# Patient Record
Sex: Female | Born: 1955 | Race: White | Hispanic: No | Smoking: Never smoker
Health system: Southern US, Community
[De-identification: ages and names within clinical notes are randomized; demographics above are authoritative.]

## PROBLEM LIST (undated history)

## (undated) DIAGNOSIS — E785 Hyperlipidemia, unspecified: Secondary | ICD-10-CM

## (undated) DIAGNOSIS — I872 Venous insufficiency (chronic) (peripheral): Secondary | ICD-10-CM

## (undated) DIAGNOSIS — K589 Irritable bowel syndrome without diarrhea: Secondary | ICD-10-CM

## (undated) DIAGNOSIS — E669 Obesity, unspecified: Secondary | ICD-10-CM

## (undated) DIAGNOSIS — M797 Fibromyalgia: Secondary | ICD-10-CM

## (undated) DIAGNOSIS — D119 Benign neoplasm of major salivary gland, unspecified: Secondary | ICD-10-CM

## (undated) DIAGNOSIS — J45909 Unspecified asthma, uncomplicated: Secondary | ICD-10-CM

## (undated) DIAGNOSIS — K219 Gastro-esophageal reflux disease without esophagitis: Secondary | ICD-10-CM

## (undated) DIAGNOSIS — L723 Sebaceous cyst: Secondary | ICD-10-CM

## (undated) DIAGNOSIS — G43909 Migraine, unspecified, not intractable, without status migrainosus: Secondary | ICD-10-CM

## (undated) DIAGNOSIS — D369 Benign neoplasm, unspecified site: Secondary | ICD-10-CM

## (undated) DIAGNOSIS — IMO0002 Reserved for concepts with insufficient information to code with codable children: Secondary | ICD-10-CM

## (undated) DIAGNOSIS — F419 Anxiety disorder, unspecified: Secondary | ICD-10-CM

## (undated) DIAGNOSIS — R609 Edema, unspecified: Secondary | ICD-10-CM

## (undated) HISTORY — DX: Benign neoplasm of major salivary gland, unspecified: D11.9

## (undated) HISTORY — DX: Fibromyalgia: M79.7

## (undated) HISTORY — DX: Venous insufficiency (chronic) (peripheral): I87.2

## (undated) HISTORY — DX: Migraine, unspecified, not intractable, without status migrainosus: G43.909

## (undated) HISTORY — DX: Anxiety disorder, unspecified: F41.9

## (undated) HISTORY — DX: Obesity, unspecified: E66.9

## (undated) HISTORY — DX: Irritable bowel syndrome, unspecified: K58.9

## (undated) HISTORY — DX: Sebaceous cyst: L72.3

## (undated) HISTORY — DX: Reserved for concepts with insufficient information to code with codable children: IMO0002

## (undated) HISTORY — DX: Edema, unspecified: R60.9

## (undated) HISTORY — PX: MOHS SURGERY: SUR867

## (undated) HISTORY — DX: Hyperlipidemia, unspecified: E78.5

## (undated) HISTORY — DX: Benign neoplasm, unspecified site: D36.9

## (undated) HISTORY — DX: Gastro-esophageal reflux disease without esophagitis: K21.9

## (undated) HISTORY — DX: Unspecified asthma, uncomplicated: J45.909

---

## 1986-07-21 HISTORY — PX: CHOLECYSTECTOMY: SHX55

## 1991-07-22 HISTORY — PX: ABDOMINAL HYSTERECTOMY: SHX81

## 1991-07-22 HISTORY — PX: OTHER SURGICAL HISTORY: SHX169

## 1999-07-22 HISTORY — PX: HEMORRHOID SURGERY: SHX153

## 1999-12-03 ENCOUNTER — Ambulatory Visit (HOSPITAL_COMMUNITY): Admission: RE | Admit: 1999-12-03 | Discharge: 1999-12-03 | Payer: Self-pay | Admitting: Obstetrics and Gynecology

## 1999-12-03 ENCOUNTER — Encounter: Payer: Self-pay | Admitting: Obstetrics and Gynecology

## 1999-12-11 ENCOUNTER — Other Ambulatory Visit: Admission: RE | Admit: 1999-12-11 | Discharge: 1999-12-11 | Payer: Self-pay | Admitting: Obstetrics and Gynecology

## 2000-02-10 ENCOUNTER — Encounter (INDEPENDENT_AMBULATORY_CARE_PROVIDER_SITE_OTHER): Payer: Self-pay | Admitting: *Deleted

## 2000-02-10 ENCOUNTER — Ambulatory Visit (HOSPITAL_BASED_OUTPATIENT_CLINIC_OR_DEPARTMENT_OTHER): Admission: RE | Admit: 2000-02-10 | Discharge: 2000-02-10 | Payer: Self-pay | Admitting: Surgery

## 2001-03-23 ENCOUNTER — Encounter: Payer: Self-pay | Admitting: Obstetrics and Gynecology

## 2001-03-23 ENCOUNTER — Ambulatory Visit (HOSPITAL_COMMUNITY): Admission: RE | Admit: 2001-03-23 | Discharge: 2001-03-23 | Payer: Self-pay | Admitting: Obstetrics and Gynecology

## 2002-06-17 ENCOUNTER — Encounter: Payer: Self-pay | Admitting: Obstetrics and Gynecology

## 2002-06-17 ENCOUNTER — Ambulatory Visit (HOSPITAL_COMMUNITY): Admission: RE | Admit: 2002-06-17 | Discharge: 2002-06-17 | Payer: Self-pay | Admitting: Obstetrics and Gynecology

## 2003-06-29 ENCOUNTER — Ambulatory Visit (HOSPITAL_COMMUNITY): Admission: RE | Admit: 2003-06-29 | Discharge: 2003-06-29 | Payer: Self-pay | Admitting: Obstetrics and Gynecology

## 2005-01-18 HISTORY — PX: OTHER SURGICAL HISTORY: SHX169

## 2005-02-10 ENCOUNTER — Ambulatory Visit (HOSPITAL_COMMUNITY): Admission: RE | Admit: 2005-02-10 | Discharge: 2005-02-11 | Payer: Self-pay | Admitting: Orthopaedic Surgery

## 2005-08-01 ENCOUNTER — Ambulatory Visit: Payer: Self-pay | Admitting: Pulmonary Disease

## 2006-03-10 ENCOUNTER — Encounter: Admission: RE | Admit: 2006-03-10 | Discharge: 2006-03-10 | Payer: Self-pay | Admitting: Orthopaedic Surgery

## 2006-03-13 ENCOUNTER — Ambulatory Visit: Payer: Self-pay | Admitting: Pulmonary Disease

## 2007-06-22 ENCOUNTER — Ambulatory Visit: Payer: Self-pay | Admitting: Pulmonary Disease

## 2007-06-23 DIAGNOSIS — E669 Obesity, unspecified: Secondary | ICD-10-CM

## 2007-06-23 DIAGNOSIS — K219 Gastro-esophageal reflux disease without esophagitis: Secondary | ICD-10-CM

## 2007-06-23 DIAGNOSIS — K589 Irritable bowel syndrome without diarrhea: Secondary | ICD-10-CM

## 2007-06-23 DIAGNOSIS — R609 Edema, unspecified: Secondary | ICD-10-CM

## 2007-06-23 DIAGNOSIS — I872 Venous insufficiency (chronic) (peripheral): Secondary | ICD-10-CM | POA: Insufficient documentation

## 2007-06-23 DIAGNOSIS — J209 Acute bronchitis, unspecified: Secondary | ICD-10-CM

## 2007-07-15 IMAGING — CT CT CERVICAL SPINE W/O CM
3 of 5 series · 12 of 28 positions shown, 14 images · IV contrast (agent unspecified)
Comparison: Intraoperative [HOSPITAL] cervical spine radiographs, 02/10/05.

CLINICAL DATA: Fall.  Cervical fusion with current neck and right shoulder pain.
CERVICAL SPINE CT WITHOUT CONTRAST:
TECHNIQUE: Multidetector CT imaging of the cervical spine was performed.  Multiplanar CT image reconstructions were also generated.

[Series 3: recon 2: cervical spine · axial · 0.23mm/px · z∈[+10,+72]mm · 2 of 151 slices shown]
[im 51/151  bone]
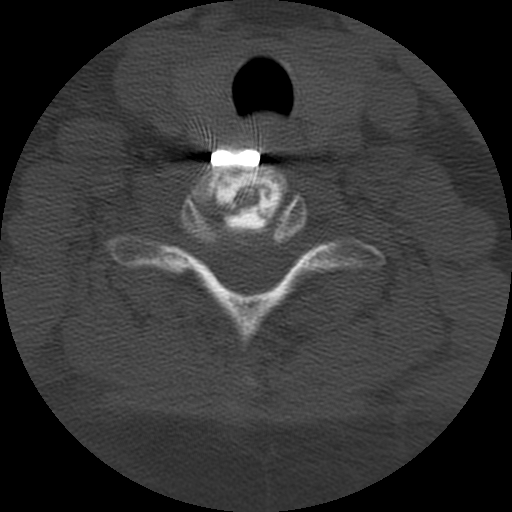
[im 101/151  bone]
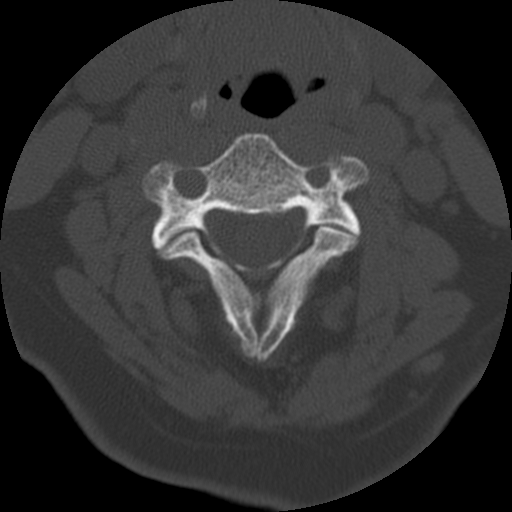

[Series 4: recon 3: cervical spine · axial · 0.23mm/px · z∈[-22,+103]mm · 5 of 301 slices shown, 7 images]
[im 51/301  soft-tissue]
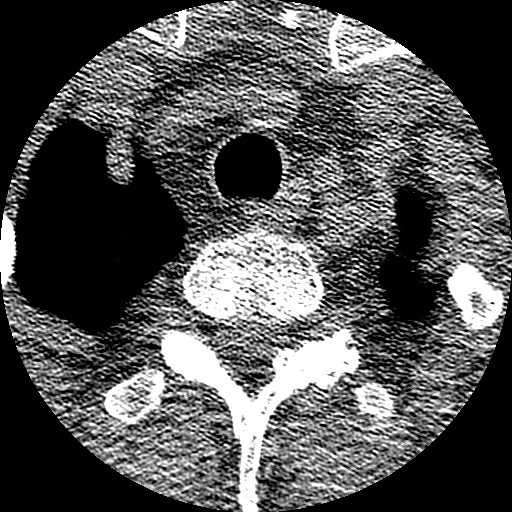
[im 51/301  bone]
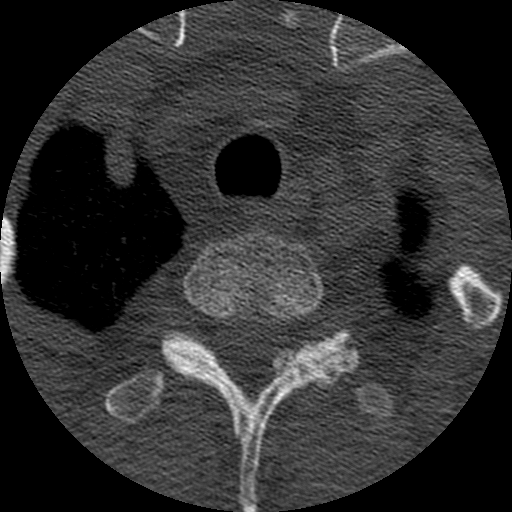
[im 101/301  bone]
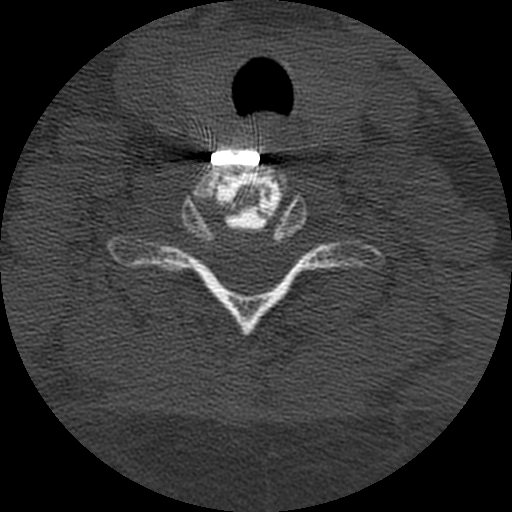
[im 151/301  bone]
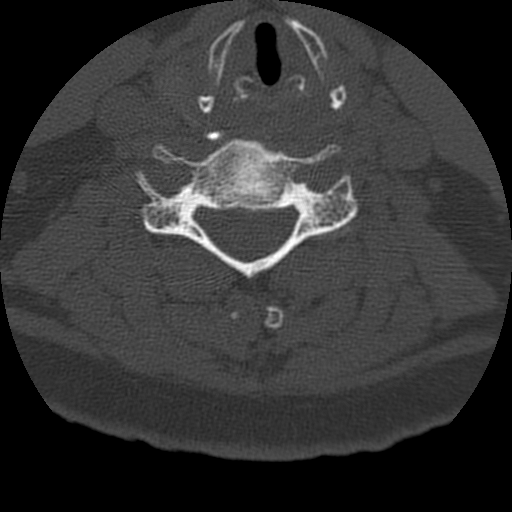
[im 201/301  bone]
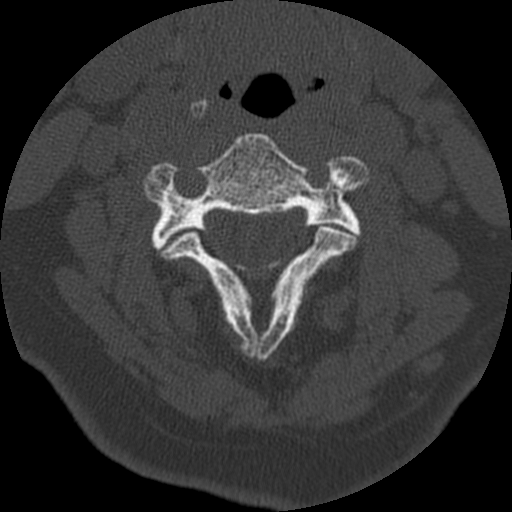
[im 251/301  soft-tissue]
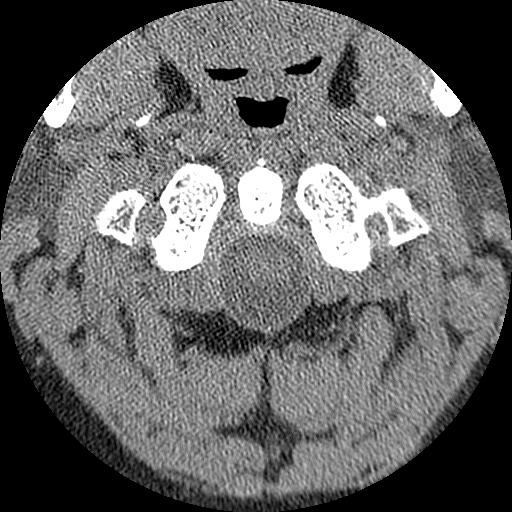
[im 251/301  bone]
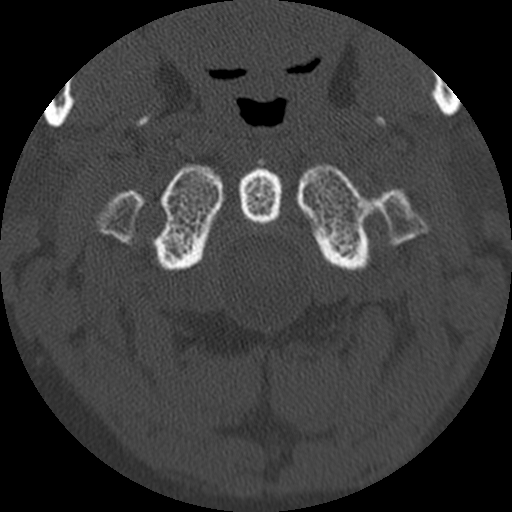

[Series 400: reformatted · sagittal · 0.37mm/px · 5 of 40 slices shown]
[im 7/40  bone]
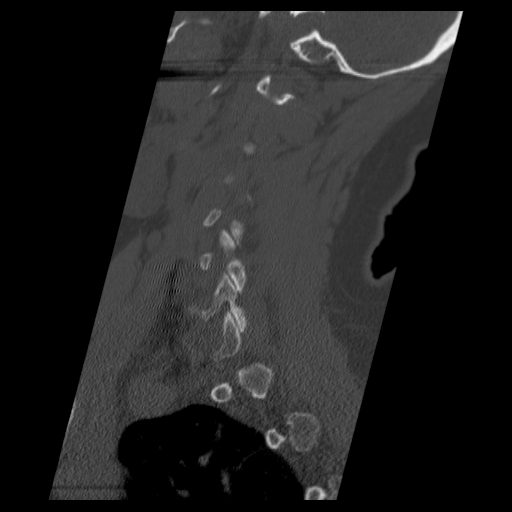
[im 14/40  bone]
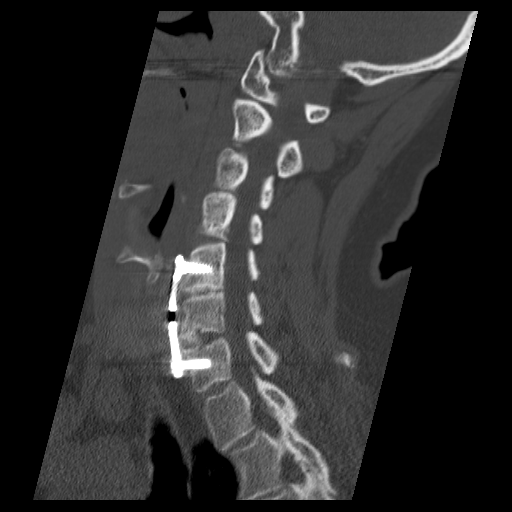
[im 20/40  bone]
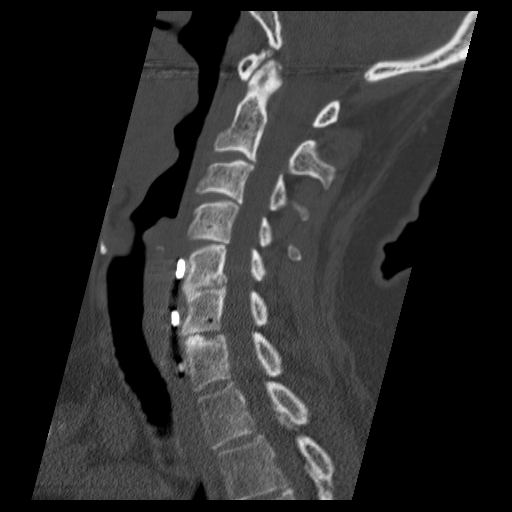
[im 27/40  bone]
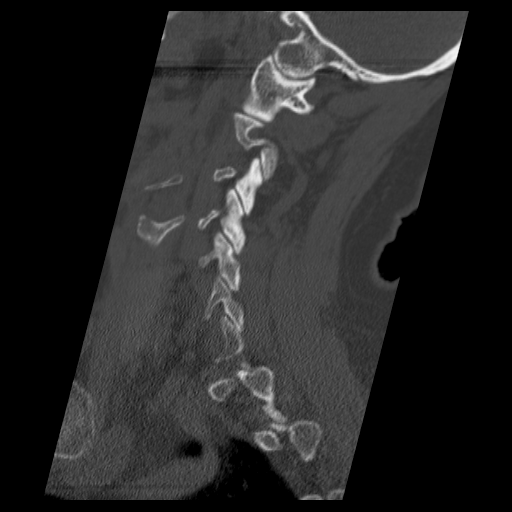
[im 33/40  bone]
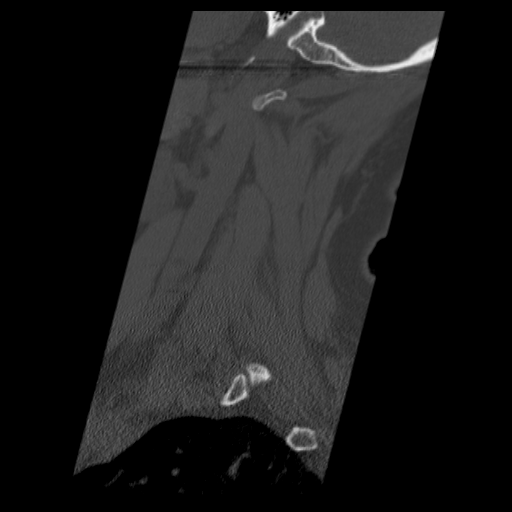

[12 of 28 positions shown; findings below may reference images not displayed]

C2-3:  Disk appears normal with no neural impingement.
C3-4:  Slight narrowing at the left neural foramen with disk appearing normal and no significant neural impingement seen.
C4-5:  Slight central disk bulge is seen with slight narrowing at the left neural foramen with no significant neural impingement nor acquired spinal stenosis seen.
C5-6 and C6-7:  Anterior cervical diskectomy and fusion hardware demonstrates screws in satisfactory position with no surrounding osteolysis/loosening at C5 and C7.  The C5-6 and C6-7 interbody bone plugs show full osseous incorporation at their inferior extent with areas of partial nonunion at their superior extent, more marked at C6 inferior end plate than C5 inferior end plate.  At C5-6 is slight left neural foraminal narrowing and slight posterior spondylosis causing borderline central canal stenosis at the inferior C5 level with the central canal measuring 9 mm AP (axial image 84 and sagittal image 19).  Similarly, borderline central canal stenosis is seen at C6-7 disk level measuring 9 mm AP (axial image 102/2).  
C7-T1 and T1-2:  No significant abnormality is seen at these levels.  
Posterior vertebral alignment appears maintained with slight degenerative change at the anterior C1-2 articulation.
IMPRESSION: 1.  Satisfactory anterior cervical diskectomy fusion hardware, C5 through C7, with no evidence for loosening.
2.  Satisfactory position of interbody bone plugs, C5-6 and C6-7, yet incomplete osseous fusion at their superior extents, as described.
3.  Borderline central canal stenosis at C5-6 and C6-7 due to posterior osteophytic ridging.
4.   Slight left sided neural foraminal narrowing at C3-4, C4-5, and C5-6 with no other CT evidence for significant neural impingement/acquired spinal stenosis.
5.  Otherwise no significant abnormality.

## 2007-07-21 ENCOUNTER — Telehealth (INDEPENDENT_AMBULATORY_CARE_PROVIDER_SITE_OTHER): Payer: Self-pay | Admitting: *Deleted

## 2008-02-09 ENCOUNTER — Telehealth: Payer: Self-pay | Admitting: Pulmonary Disease

## 2008-02-10 ENCOUNTER — Telehealth (INDEPENDENT_AMBULATORY_CARE_PROVIDER_SITE_OTHER): Payer: Self-pay | Admitting: *Deleted

## 2008-02-10 ENCOUNTER — Ambulatory Visit: Payer: Self-pay | Admitting: Internal Medicine

## 2008-02-10 ENCOUNTER — Encounter: Payer: Self-pay | Admitting: Pulmonary Disease

## 2008-02-10 DIAGNOSIS — L723 Sebaceous cyst: Secondary | ICD-10-CM

## 2008-10-19 ENCOUNTER — Telehealth: Payer: Self-pay | Admitting: Pulmonary Disease

## 2009-02-16 ENCOUNTER — Ambulatory Visit: Payer: Self-pay | Admitting: Pulmonary Disease

## 2009-02-16 ENCOUNTER — Encounter: Payer: Self-pay | Admitting: Adult Health

## 2009-02-16 DIAGNOSIS — G43909 Migraine, unspecified, not intractable, without status migrainosus: Secondary | ICD-10-CM | POA: Insufficient documentation

## 2009-02-16 DIAGNOSIS — M5030 Other cervical disc degeneration, unspecified cervical region: Secondary | ICD-10-CM

## 2009-02-16 DIAGNOSIS — E785 Hyperlipidemia, unspecified: Secondary | ICD-10-CM

## 2009-02-16 DIAGNOSIS — IMO0001 Reserved for inherently not codable concepts without codable children: Secondary | ICD-10-CM

## 2009-02-16 LAB — CONVERTED CEMR LAB
ALT: 18 units/L (ref 0–35)
Basophils Absolute: 0.1 10*3/uL (ref 0.0–0.1)
Basophils Relative: 0.7 % (ref 0.0–3.0)
Bilirubin Urine: NEGATIVE
Bilirubin, Direct: 0.1 mg/dL (ref 0.0–0.3)
Cholesterol: 180 mg/dL (ref 0–200)
Creatinine, Ser: 0.6 mg/dL (ref 0.4–1.2)
Eosinophils Absolute: 0.2 10*3/uL (ref 0.0–0.7)
Eosinophils Relative: 2 % (ref 0.0–5.0)
HDL: 39.9 mg/dL (ref >39.00)
Ketones, ur: NEGATIVE mg/dL
Leukocytes, UA: NEGATIVE
Lymphocytes Relative: 37.9 % (ref 12.0–46.0)
Lymphs Abs: 3 10*3/uL (ref 0.7–4.0)
MCV: 87.3 fL (ref 78.0–100.0)
Neutro Abs: 3.9 10*3/uL (ref 1.4–7.7)
Neutrophils Relative %: 51 % (ref 43.0–77.0)
Platelets: 243 10*3/uL (ref 150.0–400.0)
Potassium: 4.1 meq/L (ref 3.5–5.1)
RDW: 12.7 % (ref 11.5–14.6)
Specific Gravity, Urine: 1.02 (ref 1.000–1.030)
TSH: 1.65 microintl units/mL (ref 0.35–5.50)
Total Bilirubin: 0.9 mg/dL (ref 0.3–1.2)

## 2009-03-06 ENCOUNTER — Encounter: Payer: Self-pay | Admitting: Pulmonary Disease

## 2009-03-06 LAB — CONVERTED CEMR LAB: Vit D, 25-Hydroxy: 32 ng/mL (ref 30–89)

## 2009-03-12 ENCOUNTER — Telehealth: Payer: Self-pay | Admitting: Pulmonary Disease

## 2009-08-30 ENCOUNTER — Telehealth (INDEPENDENT_AMBULATORY_CARE_PROVIDER_SITE_OTHER): Payer: Self-pay | Admitting: *Deleted

## 2009-08-31 ENCOUNTER — Telehealth (INDEPENDENT_AMBULATORY_CARE_PROVIDER_SITE_OTHER): Payer: Self-pay | Admitting: *Deleted

## 2010-02-06 ENCOUNTER — Telehealth: Payer: Self-pay | Admitting: Pulmonary Disease

## 2010-04-25 ENCOUNTER — Ambulatory Visit: Payer: Self-pay | Admitting: Pulmonary Disease

## 2010-05-02 ENCOUNTER — Ambulatory Visit (HOSPITAL_COMMUNITY): Admission: RE | Admit: 2010-05-02 | Discharge: 2010-05-02 | Payer: Self-pay | Admitting: Family Medicine

## 2010-07-02 ENCOUNTER — Ambulatory Visit: Payer: Self-pay | Admitting: Pulmonary Disease

## 2010-07-03 ENCOUNTER — Encounter: Payer: Self-pay | Admitting: Pulmonary Disease

## 2010-07-05 ENCOUNTER — Ambulatory Visit: Payer: Self-pay | Admitting: Pulmonary Disease

## 2010-07-05 LAB — CONVERTED CEMR LAB
Basophils Relative: 1.3 % (ref 0.0–3.0)
Bilirubin Urine: NEGATIVE
Cholesterol: 197 mg/dL (ref 0–200)
Eosinophils Relative: 2.3 % (ref 0.0–5.0)
GFR calc non Af Amer: 102.22 mL/min (ref >60.00)
Glucose, Bld: 82 mg/dL (ref 70–99)
HDL: 39.5 mg/dL (ref >39.00)
Hemoglobin: 14.3 g/dL (ref 12.0–15.0)
Ketones, ur: NEGATIVE mg/dL
Leukocytes, UA: NEGATIVE
Lymphocytes Relative: 29.6 % (ref 12.0–46.0)
MCHC: 34.7 g/dL (ref 30.0–36.0)
Neutro Abs: 4.6 10*3/uL (ref 1.4–7.7)
Neutrophils Relative %: 58.6 % (ref 43.0–77.0)
Platelets: 244 10*3/uL (ref 150.0–400.0)
Potassium: 4.5 meq/L (ref 3.5–5.1)
TSH: 1.76 microintl units/mL (ref 0.35–5.50)
Total Protein, Urine: NEGATIVE mg/dL
Urine Glucose: NEGATIVE mg/dL
Urobilinogen, UA: 0.2 (ref 0.0–1.0)
VLDL: 36.6 mg/dL (ref 0.0–40.0)
Vit D, 25-Hydroxy: 46 ng/mL (ref 30–89)
pH: 6 (ref 5.0–8.0)

## 2010-08-20 NOTE — Progress Notes (Signed)
Summary: talk to nurse  Phone Note From Pharmacy Call back at [phone]   [facility]  [place] [address] Call For: nadel  Summary of Call:  pharmacy have Questions about cipro and xanaflex  Initial call taken by: Rickard Patience,  August 31, 2009 1:33 PM  Follow-up for Phone Call        Spoke with pharmacist.  She states that there is a level 1 interaction between xanaflex and cipro.  Pharmacist advised her while on cipro, not to take the xanaflex, but pt states that she is unable to do this.  Pharmacist states that tking these together can cause metabolic issues.  Please advise thanks! Vernie Murders  August 31, 2009 2:04 PM'  Additional Follow-up for Phone Call Additional follow up Details #1::        per SN----interaction is only a "potential problem"  and most people tolerate these two meds fine---SN rec decreasing the zanaflex to as much as as needed while on the cipro.  thanks Randell Loop CMA  August 31, 2009 2:33 PM   pharmacy aware and states they will advise pt--tried to call pt but vm not set up to leave msg, told pharmacy if pt had  questions to have her give [place] a call Additional Follow-up by: Philipp Deputy CMA,  August 31, 2009 2:42 PM

## 2010-08-20 NOTE — Progress Notes (Signed)
Summary: suspected flu?  Phone Note Call from Patient Call back at [phone]   Caller: Patient Call For: nadel Summary of Call: Pt suspects she has stomach flu: diarrhea,dizziness, lightheaded, aches, pains, no fever. Was offered an appt. she refused, please advise. Initial call taken by: Darletta Moll,  August 30, 2009 8:50 AM  Follow-up for Phone Call        Spoke with pt.  She c/o diarrhea, nausea, and stomach pains x 3 days.  She c/o feeling dizzy and lightheaded.  She had fever last night but none today.  Wants something for her symptoms.  I advised that she try immodium for diarrhea and increase fluids.  Tylenol as directed for fever/aches.  She states that her sister had the the same thing and was txed with abx.  Please advise recs thanks Follow-up by: Vernie Murders,  August 30, 2009 9:08 AM  Additional Follow-up for Phone Call Additional follow up Details #1::        per SN---immodium as needed and use tylenol as needed for fever and aches---please call in phenergan 25mg   #12  1  by mouth every 6 hours as needed for nausea and cipro 250mg   #10  1 by mouth two times a day until gone.  thanks Randell Loop CMA  August 30, 2009 1:55 PM     Additional Follow-up for Phone Call Additional follow up Details #2::    Spoke with pt and made aware of recs per SN.  Pt verbalized understanding.  [facility] [place]. Follow-up by: Vernie Murders,  August 30, 2009 2:06 PM  New/Updated Medications: PROMETHAZINE HCL 25 MG TABS (PROMETHAZINE HCL) 1 every 6 hours as needed CIPRO 250 MG TABS (CIPROFLOXACIN HCL) 1 by mouth two times a day  until gone Prescriptions: CIPRO 250 MG TABS (CIPROFLOXACIN HCL) 1 by mouth two times a day  until gone  #10 x 0   Entered by:   Vernie Murders   Authorized by:   Michele Mcalpine MD   Signed by:   Vernie Murders on 08/30/2009   Method used:   [facility]  [place] [address]       [address]       [place],  [place]  [address]       Ph: [phone] or [phone]       Fax: [phone]   RxID:   [id number] PROMETHAZINE HCL 25 MG TABS (PROMETHAZINE HCL) 1 every 6 hours as needed  #12 x 0   Entered by:   Vernie Murders   Authorized by:   Michele Mcalpine MD   Signed by:   Vernie Murders on 08/30/2009   Method used:   [facility]        [facility]  [place] [address]       [address]       [place], [place]  [address]       Ph: [phone] or [phone]       Fax: [phone]   RxID:   [id number]

## 2010-08-20 NOTE — Assessment & Plan Note (Signed)
Summary: NP follow up - bronchitis and med refills   CC:  rov for med refills; sinus pressure/congestion with green mucus and PND causing prod cough.  History of Present Illness: 55 year old female with known history of Fibromyalgia, migraine headache, and IBS.   April 25, 2010--Presents for a work in visit and med refills. Complains of  sinus pressure/congestion with green mucus, PND causing prod cough with green mucus. She is due for physical would like refills until she can get in for physical. .Has been doing okay until last week. Has intermittent stuffy nose. Had similar symptoms 4 months ago. Have waxed and waned with sinus congestion for last several weeks. Denies chest pain, orthopnea, hemoptysis, fever, n/v/d, edema, headache.   Medications Prior to Update: 1)  Miralax   Powd (Polyethylene Glycol 3350) .... Once Daily 2)  Zanaflex 4 Mg  Tabs (Tizanidine Hcl) .... 1/2 To 1 Tab By Mouth Three Times A Day As Directed... 3)  Citalopram Hydrobromide 20 Mg Tabs (Citalopram Hydrobromide) .... Take 2 Tabs By Mouth Once Daily.Marland KitchenMarland Kitchen 4)  Coq10 30 Mg Caps (Coenzyme Q10) .... Take 1 Tablet By Mouth Once A Day 5)  Calcium-Vitamin D 250-125 Mg-Unit Tabs (Calcium Carbonate-Vitamin D) .... Take 1 Tablet By Mouth Two Times A Day 6)  Vitamin C 500 Mg Tabs (Ascorbic Acid) .... Take 1 Tablet By Mouth Once A Day 7)  Vitamin D 1000 Unit Tabs (Cholecalciferol) .... Take 1 Tablet By Mouth Once A Day 8)  Magnesium Oxide 250 Mg Tabs (Magnesium Oxide) .... Take 1 Tablet By Mouth Once A Day 9)  Promethazine Hcl 25 Mg Tabs (Promethazine Hcl) .Marland Kitchen.. 1 Every 6 Hours As Needed 10)  Cipro 250 Mg Tabs (Ciprofloxacin Hcl) .Marland Kitchen.. 1 By Mouth Two Times A Day  Until Gone 11)  Zithromax Z-Pak 250 Mg Tabs (Azithromycin) .... Take As Directed  Current Medications (verified): 1)  Miralax   Powd (Polyethylene Glycol 3350) .... Once Daily As Needed 2)  Zanaflex 4 Mg  Tabs (Tizanidine Hcl) .... 1/2 To 1 Tab By Mouth Three Times A Day  As Directed... 3)  Citalopram Hydrobromide 20 Mg Tabs (Citalopram Hydrobromide) .... Take 2 Tabs By Mouth Once Daily.Marland KitchenMarland Kitchen 4)  Coenzyme Q10 100 Mg Caps (Coenzyme Q10) .... Take 1 Capsule By Mouth Once A Day 5)  Calcium Carbonate-Vitamin D 600-400 Mg-Unit  Tabs (Calcium Carbonate-Vitamin D) .... Take 1 Tablet By Mouth Once A Day 6)  Vitamin C 500 Mg Tabs (Ascorbic Acid) .... Take 1 Tablet By Mouth Once A Day 7)  Vitamin D 1000 Unit Tabs (Cholecalciferol) .... Take 1 Tablet By Mouth Two Times A Day 8)  Magnesium Oxide 250 Mg Tabs (Magnesium Oxide) .... Take 1 Tablet By Mouth Once A Day 9)  Promethazine Hcl 25 Mg Tabs (Promethazine Hcl) .Marland Kitchen.. 1 Every 6 Hours As Needed 10)  Ibuprofen 200 Mg Tabs (Ibuprofen) .... Per Bottle  Allergies (verified): 1)  ! 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 Mother died age 70 w/ emphysema & aspergillus 2 Siblings:  Sisters w/ hx HBP...  Social History: Last updated: 02/16/2009 Married, husb= Hilda, 27 yrs. 2 children never smoked social alcohol caffeine- 4 cups employ- customer service, [employer].  Risk Factors: Smoking Status: never (02/10/2008)  Review of Systems      See  HPI  Vital Signs:  Patient profile:   55 year old female Height:      68 inches Weight:      265.50 pounds BMI:     40.52 O2 Sat:      97 % on Room air Temp:     99.9 degrees F oral Pulse rate:   98 / minute BP sitting:   150 / 82  (left arm) Cuff size:   large  Vitals Entered By: Boone Master CNA/MA (April 25, 2010 3:54 PM)  O2 Flow:  Room air CC: rov for med refills; sinus pressure/congestion with green mucus, PND causing prod cough Is Patient Diabetic? No Comments Medications reviewed with patient Daytime contact number verified with patient. Boone Master CNA/MA  April 25, 2010 3:54 PM    Physical Exam  Additional Exam:  WD, Overweight, 55 y/o WF in NAD... GENERAL:  Alert & oriented; pleasant & cooperative... HEENT:  Dor[place]/AT, EOM-wnl, PERRLA, Fundi-benign, EACs-clear, TMs-wnl, NOSE-red mucsoa, max snus tenderness THROAT-clear & wnl. NECK:  Supple w/ decrROM & scar of surg; no JVD; normal carotid impulses w/o bruits; no thyromegaly or nodules palpated; no lymphadenopathy. CHEST:  Clear to P & A; without wheezes/ rales/ or rhonchi. HEART:  Regular Rhythm; without murmurs/ rubs/ or gallops. ABDOMEN:  Obese, soft & nontender; normal bowel sounds; no organomegaly or masses detected. EXT: without deformities, mild arthritic changes; no varicose veins/ +venous insuffic/ tr edema. NEURO:  intact w/ no focal deficits noted.      Impression & Recommendations:  Problem # 1:  ASTHMATIC BRONCHITIS, ACUTE (ICD-466.0)  Acute flare w/ associated sinusitis Plan:  Augmentin 875mg  two times a day for 10 days Nasonex 2 puffs two times a day until gone.  Mucinex DM two times a day as needed as needed cough/congestion  Saline nasal rinses as needed  Zyrtec 10mg  at bedtime as needed drainage.  increase fluids.  Please contact office for sooner follow up if symptoms do not improve or worsen  The following medications were removed from the medication list:    Cipro 250 Mg Tabs  (Ciprofloxacin hcl) .Marland Kitchen... 1 by mouth two times a day  until gone    Zithromax Z-pak 250 Mg Tabs (Azithromycin) .Marland Kitchen... Take as directed Her updated medication list for this problem includes:    Augmentin 875-125 Mg Tabs (Amoxicillin-pot clavulanate) .Marland Kitchen... 1 by mouth two times a day  Orders: Est. Patient Level IV ([address]301)  Problem # 2:  OBESITY (ICD-278.00) return for CPX  overdue for mammogram and GYN exam. --set up for today   Orders: Radiology Referral (Radiology) Gynecologic Referral (Gyn)  Medications Added to Medication List This Visit: 1)  Miralax Powd (Polyethylene glycol 3350) .... Once daily as needed 2)  Coenzyme Q10 100 Mg Caps (Coenzyme q10) .... Take 1 capsule by mouth once a day 3)  Calcium Carbonate-vitamin D 600-400 Mg-unit Tabs (Calcium carbonate-vitamin d) .... Take 1 tablet by mouth once a day 4)  Vitamin D 1000 Unit Tabs (Cholecalciferol) .... Take 1 tablet by mouth two times a day 5)  Ibuprofen 200 Mg Tabs (  Ibuprofen) .... Per bottle 6)  Augmentin 875-125 Mg Tabs (Amoxicillin-pot clavulanate) .Marland Kitchen.. 1 by mouth two times a day  Complete Medication List: 1)  Miralax Powd (Polyethylene glycol 3350) .... Once daily as needed 2)  Zanaflex 4 Mg Tabs (Tizanidine hcl) .... 1/2 to 1 tab by mouth three times a day as directed... 3)  Citalopram Hydrobromide 20 Mg Tabs (Citalopram hydrobromide) .... Take 2 tabs by mouth once daily.Marland KitchenMarland Kitchen 4)  Coenzyme Q10 100 Mg Caps (Coenzyme q10) .... Take 1 capsule by mouth once a day 5)  Calcium Carbonate-vitamin D 600-400 Mg-unit Tabs (Calcium carbonate-vitamin d) .... Take 1 tablet by mouth once a day 6)  Vitamin C 500 Mg Tabs (Ascorbic acid) .... Take 1 tablet by mouth once a day 7)  Vitamin D 1000 Unit Tabs (Cholecalciferol) .... Take 1 tablet by mouth two times a day 8)  Magnesium Oxide 250 Mg Tabs (Magnesium oxide) .... Take 1 tablet by mouth once a day 9)  Promethazine Hcl 25 Mg Tabs (Promethazine hcl) .Marland Kitchen.. 1 every 6 hours as needed 10)   Ibuprofen 200 Mg Tabs (Ibuprofen) .... Per bottle 11)  Augmentin 875-125 Mg Tabs (Amoxicillin-pot clavulanate) .Marland Kitchen.. 1 by mouth two times a day  Patient Instructions: 1)  follow up Dr. Kriste Basque for Physical , first available.  2)  We are setting you up for mammogram. and GYN appointment.  3)  Augmentin 875mg  two times a day for 10 days 4)  Nasonex 2 puffs two times a day until gone.  5)  Mucinex DM two times a day as needed as needed cough/congestion  6)  Saline nasal rinses as needed  7)  Zyrtec 10mg  at bedtime as needed drainage.  8)  increase fluids.  9)  Please contact office for sooner follow up if symptoms do not improve or worsen  Prescriptions: AUGMENTIN 875-125 MG TABS (AMOXICILLIN-POT CLAVULANATE) 1 by mouth two times a day  #20 x 0   Entered and Authorized by:   Rubye Oaks NP   Signed by:   Rubye Oaks NP on 04/25/2010   Method used:   Electronically to        [facility]  [phone]* (retail)       [address]       [place], [place]  [address]       Ph: [phone] or [phone]       Fax: [phone]   RxID:   [id number] ZANAFLEX 4 MG  TABS (TIZANIDINE HCL) 1/2 to 1 tab by mouth three times a day as directed...  #90 Tablet x 5   Entered and Authorized by:   Rubye Oaks NP   Signed by:   Rubye Oaks NP on 04/25/2010   Method used:   Electronically to        [facility]  [phone]* (retail)       [address]       [place], [place]  [address]       Ph: [phone] or [phone]       Fax: [phone]   RxID:   [id number] CITALOPRAM HYDROBROMIDE 20 MG TABS (CITALOPRAM HYDROBROMIDE) take 2 tabs by mouth once daily...  #60 Tablet x 11   Entered and Authorized by:   Rubye Oaks NP   Signed by:   Tammy Parrett NP on 04/25/2010   Method used:   Electronically to  [facility]  [address]  [phone]* (retail)       [address]       [place], [place]  [address]       Ph: [phone] or [phone]       Fax: [phone]   RxID:   [id number]

## 2010-08-20 NOTE — Progress Notes (Signed)
Summary: rx req/ sinus infection  Phone Note Call from Patient   Caller: Patient Call For: nadel Summary of Call: pt c/o sinus infection x 5 days. ear pain/ pressure/ non-productive cough/ low-grade fever; however, pt is at work today. she requests an abx be called in (says dr nadel usually does this for her). [facility] [place] # [phone] or switchboard- ask for pt- [phone] Initial call taken by: Tivis Ringer, CNA,  February 06, 2010 10:00 AM  Follow-up for Phone Call        called and spoke with pt---she stated that sinus pressure and pain x 5 days.  she is afraid this is going to go to her chest.  pt is requesting abx.  please advise.  thanks   ALLERGIES--CODEINE  Additional Follow-up for Phone Call Additional follow up Details #1::        ok to give pt zpak---zpak has been sent to pts pharmacy--pt is aware Randell Loop CMA  February 06, 2010 10:12 AM     New/Updated Medications: ZITHROMAX Z-PAK 250 MG TABS (AZITHROMYCIN) take as directed Prescriptions: ZITHROMAX Z-PAK 250 MG TABS (AZITHROMYCIN) take as directed  #1 pak x 0   Entered by:   Randell Loop CMA   Authorized by:   Michele Mcalpine MD   Signed by:   Randell Loop CMA on 02/06/2010   Method used:   [facility]  [place] [address] [facility]       [address]       [place], [place]  [address]       Ph: [phone] or [phone]       Fax: [phone]   RxID:   [id number]

## 2010-08-22 NOTE — Assessment & Plan Note (Signed)
Summary: physical ///kp   CC:  17 month ROV & CPX....  History of Present Illness: 55 y/o WF here for a follow up visit & CPX... she has multiple medical problems as noted below...   ~  July 05, 2010:  59mo ROV- c/o sinus congestion, drainage, HA, sneezing, etc> using Tylenol sinus, Mucinex, Fluids, Saline, Allegra, Flonase, etc... her GYN is DrRevard w/ abn PAP- precancerous cells yrs ago & Cx frozen then;  had another abn PAP recently & told f/u 57yr but she is uncomfortable & wants me to review the report, offered second opin consult but she decines...  she also c/o continue neck & back discomfort- on Ibuprofen, rest, heat, & want Lyrica trial- OK... refills for 90d requested.    Current Problems:   PHYSICAL EXAMINATION (ICD-V70.0)  ~  GYN= DrLSmith/ DrRevard now w/ hx endometriosis, & abn PAP as noted.  ~  GI by DrPatterson and up to date w/ neg colon in 2002  ~  she takes numerous supplements: Calcium, Magnesium, Vit C, Vit D, CoQ10...   ASTHMATIC BRONCHITIS, ACUTE (ICD-466.0) - never smoker, occas episodes of URI's w/ congestion and intermittent wheezing... she's never req meds, etc...  ~  CXR 7/10 showed prev neck surg, clear lungs, NAD...  VENOUS INSUFFICIENCY (ICD-459.81) - she knows to avoid sodium, elevate legs, wear support hose, etc... she wants to see a vein doctor, she says.  HYPERLIPIDEMIA, MILD (ICD-272.4) - she has a mild mixed hyperlipidemia- diet rx & weight reduction...  ~  FLP 7/10 showed TChol 180, TG 164, HDL 40, LDL 107... rec> get wt down.  ~  FLP 12/11 showed TChol 197, TG 183, HDL 40, LDL 121... needs low chol/ low fat diet.  OBESITY (ICD-278.00) - she has been counselled on diet, exercise, etc...  ~  weights in the 2000's betw 244-284#  ~  weight 12/08 = 279#  ~  weight 7/10 = 273#  ~  weight 12/11 = 265#  GERD (ICD-530.81) - treated w/ OTC PPI or H2Blockers Prn...  ~  she had an EGD by DrPatterson in 2000 that was essentially WNL.Marland KitchenMarland Kitchen  IRRITABLE  BOWEL SYNDROME (ICD-564.1) - w/ constipation and hx hemorroids... on MIRALAX daily...  ~  colonoscopy 2/02 by DrPatterson was neg- f/u planned 46yrs...  DEGENERATIVE DISC DISEASE, CERVICAL SPINE (ICD-722.4) - hx DDD w/ pain to right shoulder leading to CSpine fusion by DrYates in 2006...  ~  s/p C5-C6 & C6-C7 anterior cervical discectomy & fusion w/  allograft & plating- 7/06 by DrYates...  FIBROMYALGIA (ICD-729.1) - on ZANAFLEX 4mg  Tid Prn & CITALOPRAM 20mg - 2tabs daily for pain... she had Rheum eval in the 1990's by DrTruslow, & DrDeveshwar in 2003... she's also had bursitis w/ injections, and a pos ANA...  MIGRAINE HEADACHE (ICD-346.90) - she [facility]Freeman [facility] in [facility] & tried Topamax + Paxil + Zanaflex...  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 Codeine  Comments:  Nurse/Medical Assistant: The patient's medications and allergies were reviewed with the patient and were updated in the Medication and Allergy Lists.  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 Father is alive, age 37 w/ hx ASPVD, stroke, AFib... Mother died age 35 w/ emphysema &  aspergillus 2 Siblings:  Sisters w/ hx HBP...  Social History: Reviewed history from 02/16/2009 and no changes required. Married, husb= Leon, 27 yrs. 2 children never smoked social alcohol caffeine- 4 cups employ- customer service, [employer].  Review of Systems       The patient complains of dyspnea on exertion, back pain, muscle cramps, and arthritis.  The patient denies fever, chills, sweats, anorexia, fatigue, weakness, malaise, weight loss, sleep disorder, blurring, diplopia, eye irritation, eye discharge, vision loss, eye pain, photophobia, earache, ear discharge, tinnitus, decreased hearing, nasal congestion, nosebleeds, sore throat, hoarseness, chest pain, palpitations, syncope, orthopnea, PND, peripheral edema, cough, dyspnea at rest, excessive sputum, hemoptysis, wheezing, pleurisy, nausea, vomiting, diarrhea, constipation, change in bowel habits, abdominal pain, melena, hematochezia, jaundice, gas/bloating, indigestion/heartburn, dysphagia, odynophagia, dysuria, hematuria, urinary frequency, urinary hesitancy, nocturia, incontinence, joint pain, joint swelling, muscle weakness, stiffness, sciatica, restless legs, leg pain at night, leg pain with exertion, rash, itching, dryness, suspicious lesions, paralysis, paresthesias, seizures, tremors, vertigo, transient blindness, frequent falls, frequent headaches, difficulty walking, depression, anxiety, memory loss, confusion, cold intolerance, heat intolerance, polydipsia, polyphagia, polyuria, unusual weight change, abnormal bruising, bleeding, enlarged lymph nodes, urticaria, allergic rash, hay fever, and recurrent infections.    Vital Signs:  Patient profile:   55 year old female Height:      68 inches Weight:      265.13 pounds BMI:     40.46 O2 Sat:      100 % on Room air Temp:     96.8 degrees F oral Pulse rate:   78 / minute BP sitting:   122 / 76  (left arm) Cuff size:   regular  Vitals Entered By: Randell Loop CMA (July 05, 2010 11:27 AM)  O2 Sat at Rest %:  100 O2 Flow:  Room air CC: 17 month ROV & CPX... Is Patient Diabetic? No Pain Assessment Patient in pain? yes      Onset of pain  sinus pressure and headaches Comments meds updated today with pt   Physical Exam  Additional Exam:  WD, Overweight, 55 y/o WF in NAD... GENERAL:  Alert & oriented; pleasant & cooperative... HEENT:  [place]/AT, EOM-wnl, PERRLA, Fundi-benign, EACs-clear, TMs-wnl, NOSE-clear, THROAT-clear & wnl. NECK:  Supple w/ decrROM & scar of surg; no JVD; normal carotid impulses w/o bruits; no thyromegaly or nodules palpated; no lymphadenopathy. CHEST:  Clear to P & A; without wheezes/ rales/ or rhonchi. HEART:  Regular Rhythm; without murmurs/ rubs/ or gallops. ABDOMEN:  Obese, soft & nontender; normal bowel sounds; no organomegaly or masses detected. EXT: without deformities, mild arthritic changes; no varicose veins/ +venous insuffic/ tr edema. NEURO:  CN's intact; motor testing normal; sensory testing normal; gait normal & balance OK. DERM:  No lesions noted; no rash etc...    MISC. Report  Procedure date:  07/03/2010  Findings:      FASTING LABS 07/03/10 all reviewed w/ the pt...  SN   Impression & Recommendations:  Problem # 1:  PHYSICAL EXAMINATION (ICD-V70.0) Refills for 90d supplies as requested...  Problem # 2:  HYPERLIPIDEMIA, MILD (ICD-272.4) We discussed die,t exercise, weight reduction...  Problem # 3:  OBESITY (ICD-278.00) We discussed the need for wt reduction...  Problem # 4:  IRRITABLE BOWEL SYNDROME (ICD-564.1) GI is stable & up to date...  Problem # 5:  FIBROMYALGIA (ICD-729.1) She wants to continue current meds and try LYRICA as well... OK try 75mg  Bid. Her updated medication list for this problem includes:    Ibuprofen 200 Mg Tabs (Ibuprofen) .Marland Kitchen... Per bottle    Zanaflex 4 Mg Tabs (Tizanidine hcl) .Marland Kitchen... 1/2 to 1 tab by mouth three times a day as directed...  Problem # 6:  OTHER MEDICAL PROBLEMS  AS NOTED>>>   Complete Medication List: 1)  Miralax Powd (Polyethylene glycol 3350) .... Once daily as needed 2)  Ibuprofen 200 Mg Tabs (Ibuprofen) .... Per bottle 3)  Zanaflex 4 Mg Tabs (Tizanidine hcl) .... 1/2 to 1 tab by mouth three times a day as directed... 4)  Citalopram Hydrobromide 20 Mg Tabs (Citalopram hydrobromide) .... Take 2 tabs by mouth once daily.Marland KitchenMarland Kitchen 5)  Coenzyme Q10 100 Mg Caps (Coenzyme q10) .... Take 1 capsule by mouth once a day 6)  Calcium Carbonate-vitamin D 600-400 Mg-unit Tabs (Calcium carbonate-vitamin d) .... Take 1 tablet by mouth once a day 7)  Vitamin C 500 Mg Tabs (Ascorbic acid) .... Take 1 tablet by mouth once a day 8)  Vitamin D 1000 Unit Tabs (Cholecalciferol) .... Take 1 tablet by mouth two times a day 9)  Magnesium Oxide 250 Mg Tabs (Magnesium oxide) .... Take 1 tablet by mouth once a day 10)  Lyrica 75 Mg Caps (Pregabalin) .... Take 1 cap by mouth two times a day...  Patient Instructions: 1)  Today we updated your med list- see below.... 2)  We reilled your meds per request... 3)  We also gave youa new perscription for Lyrica 75mg - start w/ one tab daily & slowly incr to one tab twice daily.Marland KitchenMarland Kitchen 4)  We reviewed your recent fasting blood work... 5)  Keep up the good work w/ diet + exercise... 6)  Please schedule a follow-up appointment in 1 year, sooner as needed... Prescriptions: CITALOPRAM HYDROBROMIDE 20 MG TABS (CITALOPRAM HYDROBROMIDE) take 2 tabs by mouth once daily...  #180 x 4   Entered and Authorized by:   Michele Mcalpine MD   Signed by:   Michele Mcalpine MD on 07/05/2010   Method used:   Print then Give to Patient   RxID:   [id number] ZANAFLEX 4 MG  TABS (TIZANIDINE HCL) 1/2 to 1 tab by mouth three times a day as directed...  #270 x 4   Entered and Authorized by:   Michele Mcalpine MD   Signed by:   Michele Mcalpine MD on 07/05/2010   Method used:   Print then Give to Patient   RxID:   [id number] LYRICA 75 MG CAPS (PREGABALIN) take 1 cap  by mouth two times a day...  #180 x 4   Entered and Authorized by:   Michele Mcalpine MD   Signed by:   Michele Mcalpine MD on 07/05/2010   Method used:   Print then Give to Patient   RxID:   [id number]

## 2010-12-02 ENCOUNTER — Other Ambulatory Visit: Payer: Self-pay | Admitting: Adult Health

## 2010-12-06 NOTE — Op Note (Signed)
[facility]  Patient:    Sheila Dawson, Sheila Dawson                      MRN: [id number] Proc. Date: 02/10/00 Adm. Date:  [id number] Attending:  Charlton Haws CC:         Lonzo Cloud. Kriste Basque, M.D. [facility]                           Operative Report  OFFICE MEDICAL RECORD #:  [id number]  PREOPERATIVE DIAGNOSIS:  Hemorrhoids.  POSTOPERATIVE DIAGNOSIS:  Hemorrhoids.  OPERATION:  Hemorrhoidectomy.  SURGEON:  Currie Paris, M.D.  ANESTHESIA:  General endotracheal.  CLINICAL HISTORY:  This patient is a 55 year old with chronic hemorrhoidal disease, which we had banded some internal hemorrhoids, but she has had a persistent right posterior hemorrhoid that is a combination internal/external that could not be banded and decided to proceed to excision of that.  She had a fair amount of small external disease, but internal disease was limited.  DESCRIPTION OF PROCEDURE:  Patient brought to the operating room and after satisfactory general endotracheal anesthesia had been obtained, perianal area was prepped and draped.  I injected approximately, initially 20 cc of 0.5% Marcaine with epinephrine in the perianal area to control bleeding and to assist with postoperative pain management.  Initially, approached the posterior-based hemorrhoid, which was fairly large and had both an internal and external component.  I put a crown suture in at the base of the hemorrhoid internally and tied that.  Then, made a V-shaped incision in the skin and elevated the hemorrhoid off the underlying subcutaneous tissue and sphincter muscle up to the mucocutaneous border.  I undermined some of the mucosa and some of the skin to get at the external disease nearby and then put a clamp on the base of the hemorrhoid down to the crown suture and amputated it.  The 2-0 chromic was stitched across the hemostat until I got to the end of the part that was clamped and then pulled out the  hemostat and snugged up the suture. I used a running lock to complete the closure of the mucosa up to the mucocutaneous border.  The remaining was closed with running 4-0 Vicryl.  The external disease had been undermined for a couple of cm on either side, but left the mucosa and skin intact here.  The left lateral area was approached next and this was a little bit more anterior than medial and was significantly smaller than the right posterior disease.  It was approached in a similar fashion.  In each case, I identified the sphincter to be sure we had preserved it.  There was very little right anterior disease and very little superficial disease remaining since the banding, I think, had taken care of most of that. I infiltrated about another 10 cc of Marcaine and then packed with a Vaseline gauze.  The patient tolerated the procedure well.  No operative complications. All counts were correct. DD:  02/10/00 TD:  02/10/00 Job: [id number]

## 2010-12-06 NOTE — Op Note (Signed)
[name], [name]               ACCOUNT NO.:  [id number]   MEDICAL RECORD NO.:  [id number]          PATIENT TYPE:  OIB   LOCATION:  2899                         FACILITY:  [facility]   PHYSICIAN:  Mark C. Ophelia Charter, M.D.    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   PROCEDURE:  C5-C6 and C6-C7 anterior cervical discectomy and fusion with  allograft and plating.   SURGEON:  Mark C. Ophelia Charter, M.D.   ASSISTANT:  Patrick Jupiter, RNFA   ANESTHESIA:  General orotracheal plus Marcaine skin local.   BRIEF HISTORY:  This 55 year old female has had neck pain since injury and  MRI scan showed disc protrusion with pre-existing spondylosis.  She has  failed conservative treatment and has had progressive increasing pain.   DESCRIPTION OF PROCEDURE:  After induction of general anesthesia and oral  endotracheal intubation, prepping of the neck with head halter traction  using the horseshoe head holder, the area was squared with towels and  sterile skin marker Betadine Vi-Drape application, sterile sheets and  drapes.  The arms were tucked to the side, tape was applied to the shoulders  for pull down.  The patient had a short neck with height 5 feet 7 inches,  weight 240, and this allowed some visualization in the upper portion of C5  only, obliquing the lateral x-ray slightly.  An incision was made starting  at the midline extending to the left over C6.  Narrow C5-C6 level with some  mild spurring was noted, identified with a short 25 needle with straight  clamp and confirmed with cross table lateral C-arm.  The C-arm was pushed  out of the way and self-retaining Cloward retractors were placed with deep  blades right and left, smooth blades up and down, and  discectomy performed.  Cloward cutters were stripped with Cloward curets.  A hard bur was used to  progress back to the posterior cortex removing chunks of disc.  The  operating microscope was draped and brought in.  The disc material was  removed, worse on the right than the left gutter.  This was retained by the  posterior longitudinal ligament.  There was some venous bleeding from the  longus colli and Surgicel and paddies were used for control as well as some  bone wax on the C6 vertebra.  The posterior longitudinal ligament was taken  down, on the right side there were no extruded fragments, spurs were removed  which was causing narrowing on the take off, mostly more on the right than  left nerve root.  Sizing was used and a 6L graft was selected, DBX puddy was  placed in the middle of the graft, and allograft was placed in good position  with head halter traction applied by the CRNA, placing the graft, checking  it.  It was counter sunk a few mm.  There was  good bleeding subchondral bone  present.  Next, an identical procedure was repeated at the C6-C7 level using  the operating microscope, as well.  The disc space was a little bit wider  and the 7L graft was selected and fit tightly after microdiscectomy and take  down of the posterior longitudinal ligaments, removal of spurs right and  left, stripping the uncovertebral joints.  There was good regress of fluid  on both sides of the graft.  After irrigation with saline solution, the  graft was placed and then the plate was selected which was 37 mm Gold  Synthes titanium plate, 14 mm Gold screws were used, and the small screw  plate was used to hold the plate and test it until it was in good position  AP and lateral and then drilling the holes, placement of the screws, then  the set screws.  Four screws were placed, no screws were placed in the C6  vertebra since the grafts fit tightly.  Final pictures were taken showing  position and  after irrigation with saline solution, self-retaining Clowards  were removed, the operative field was dry, repeat irrigation.  Hemovac  placed in line with the skin incision with in and out technique, 3-0 Vicryl  in the platysma, 4-0 Vicryl subcuticular closure, tincture of Benzoin, Steri-  Strips, Marcaine infiltration of the skin, Xeroform, 4 by 4s, tape, and soft  collar.  The patient was transported to the recovery room and was  transferred to the recovery room in stable condition.       MCY/MEDQ  D:  02/10/2005  T:  02/10/2005  Job:  [id number]

## 2011-03-05 ENCOUNTER — Telehealth: Payer: Self-pay | Admitting: Pulmonary Disease

## 2011-03-05 MED ORDER — AMOXICILLIN-POT CLAVULANATE 875-125 MG PO TABS: 1.0000 | ORAL_TABLET | Freq: Two times a day (BID) | ORAL | Status: AC

## 2011-03-05 NOTE — Telephone Encounter (Signed)
Per SN---options are ov with TP---ov SN next week--appt has been made for pt on Thursday 8-23 at 2 and pt is aware.  Ok per SN to give augmentin 875mg   #14  1 po bid and cont the mucinex 2 tabs bid with plenty of fluids.

## 2011-03-05 NOTE — Telephone Encounter (Signed)
Called spoke with patient who c/o prod cough with green/yellow mucus, increased SOB, wheezing, sweats x3weeks.  Has been treating with mucinex dm.  SN with no openings, declined appt with TP at this time.  ALLERGIES: codeine.  [facility].  If calling after 5pm, please call at home: [phone].  Dr Kriste Basque, please advise thanks!

## 2011-03-06 ENCOUNTER — Telehealth: Payer: Self-pay | Admitting: Pulmonary Disease

## 2011-03-06 NOTE — Telephone Encounter (Signed)
rx for pt was sent to the wrong pharmacy it should [facility] been [facility] [place] but instead was sent to [facility], spoke to pt and she states she will just go to [address] and pick it up she is in [place] at the moment, pt would like to keep appt with sn for Thursday 8/23 at 2pm. i called [facility] on [address] to confirm rx was there and they said yes it is there and ready for p/u

## 2011-03-13 ENCOUNTER — Encounter: Payer: Self-pay | Admitting: Pulmonary Disease

## 2011-03-13 ENCOUNTER — Ambulatory Visit (INDEPENDENT_AMBULATORY_CARE_PROVIDER_SITE_OTHER): Payer: PRIVATE HEALTH INSURANCE | Admitting: Pulmonary Disease

## 2011-03-13 ENCOUNTER — Ambulatory Visit (INDEPENDENT_AMBULATORY_CARE_PROVIDER_SITE_OTHER)
Admission: RE | Admit: 2011-03-13 | Discharge: 2011-03-13 | Disposition: A | Discharge status: Home / Self Care | Payer: PRIVATE HEALTH INSURANCE | Source: Ambulatory Visit | Attending: Pulmonary Disease | Admitting: Pulmonary Disease

## 2011-03-13 DIAGNOSIS — J209 Acute bronchitis, unspecified: Secondary | ICD-10-CM

## 2011-03-13 DIAGNOSIS — K589 Irritable bowel syndrome without diarrhea: Secondary | ICD-10-CM

## 2011-03-13 DIAGNOSIS — E669 Obesity, unspecified: Secondary | ICD-10-CM

## 2011-03-13 DIAGNOSIS — E785 Hyperlipidemia, unspecified: Secondary | ICD-10-CM

## 2011-03-13 DIAGNOSIS — I872 Venous insufficiency (chronic) (peripheral): Secondary | ICD-10-CM

## 2011-03-13 DIAGNOSIS — K219 Gastro-esophageal reflux disease without esophagitis: Secondary | ICD-10-CM

## 2011-03-13 DIAGNOSIS — F329 Major depressive disorder, single episode, unspecified: Secondary | ICD-10-CM

## 2011-03-13 DIAGNOSIS — IMO0001 Reserved for inherently not codable concepts without codable children: Secondary | ICD-10-CM

## 2011-03-13 DIAGNOSIS — M5030 Other cervical disc degeneration, unspecified cervical region: Secondary | ICD-10-CM

## 2011-03-13 MED ORDER — PREDNISONE 20 MG PO TABS: ORAL_TABLET | ORAL | Status: DC

## 2011-03-13 MED ORDER — DESVENLAFAXINE SUCCINATE ER 50 MG PO TB24: 50.0000 mg | ORAL_TABLET | Freq: Every day | ORAL | Status: DC

## 2011-03-13 MED ORDER — METHYLPREDNISOLONE ACETATE 80 MG/ML IJ SUSP
80.0000 mg | Freq: Once | INTRAMUSCULAR | Status: AC
  Administered 2011-03-13: 80 mg via INTRA_ARTICULAR

## 2011-03-13 NOTE — Progress Notes (Signed)
Subjective:    Patient ID: Sheila Dawson, female    DOB: 1956/06/10, 55 y.o.   MRN: [id number]  HPI 55 y/o WF here for a follow up visit & CPX... she has multiple medical problems as noted below...  ~  July 05, 2010:  79mo ROV- c/o sinus congestion, drainage, HA, sneezing, etc> using Tylenol sinus, Mucinex, Fluids, Saline, Allegra, Flonase, etc... her GYN is DrRevard w/ abn PAP- precancerous cells yrs ago & Cx frozen then;  had another abn PAP recently & told f/u 29yr but she is uncomfortable & wants me to review the report, offered second opin consult but she decines...  she also c/o continue neck & back discomfort- on Ibuprofen, rest, heat, & want Lyrica trial- OK... refills for 90d requested.  ~  March 13, 2011:  52mo ROV & add-on for month long resp tract illness characterized by sore throat, cough, scant sput production, intermittent pleuritic CP, ?fever, +sweats, & some incr SOB> she had Augmentin called in but symptoms linger & we discussed Asthmatic Bronchitis & the need for Depo & Prednisone course to reduce the bronchitic inflammation...  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           PROBLEM LIST:  ASTHMATIC BRONCHITIS, ACUTE (ICD-466.0) - never smoker, occas episodes of URI's w/ congestion and intermittent wheezing... ~  CXR 7/10 showed prev neck surg, clear lungs, NAD.Marland Kitchen. ~  8/12:  Presents w/ refractory AB episode> given Augmentin as outpt, then OV w/ Depo/ Pred taper... ~  CXR 8/12 showed clear lungs, borderline heart size, neck surg fusion plates, clips RUQ from prev GB surg...  VENOUS INSUFFICIENCY (ICD-459.81) - she knows to avoid sodium, elevate legs, wear support hose, etc... she wants to see a vein doctor, she says.  HYPERLIPIDEMIA, MILD (ICD-272.4) - she has a mild mixed hyperlipidemia- diet rx & weight reduction... ~  FLP 7/10 showed TChol 180, TG 164, HDL 40, LDL 107... rec>  get wt down. ~  FLP 12/11 showed TChol 197, TG 183, HDL 40, LDL 121... needs low chol/ low fat diet.  OBESITY (ICD-278.00) - she has been counseled on diet, exercise, etc... ~  weights in the 2000's betw 244-284# ~  weight 12/08 = 279# ~  weight 7/10 = 273# ~  weight 12/11 = 265# ~  weight 8/12 = 265#  GERD (ICD-530.81) - treated w/ OTC PPI or H2Blockers Prn... ~  she had an EGD by DrPatterson in 2000 that was essentially WNL.Marland KitchenMarland Kitchen  IRRITABLE BOWEL SYNDROME (ICD-564.1) - w/ constipation and hx hemorroids... on MIRALAX daily... ~  colonoscopy 2/02 by DrPatterson was neg- f/u planned 41yrs...  DEGENERATIVE DISC DISEASE, CERVICAL SPINE (ICD-722.4) - hx DDD w/ pain to right shoulder leading to CSpine fusion by DrYates in 2006... ~  s/p C5-C6 & C6-C7 anterior cervical discectomy & fusion w/  allograft & plating- 7/06 by DrYates...  FIBROMYALGIA (ICD-729.1) - on ZANAFLEX 4mg  Tid Prn & CITALOPRAM 20mg - 2tabs daily for pain... she had Rheum eval in the 1990's by DrTruslow, & DrDeveshwar in 2003... she's also had bursitis w/ injections, and a pos ANA... ~  We prev rec trial Lyrica but pt never filled this Rx... 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  MIGRAINE HEADACHE (ICD-346.90) - she saw DrFreeman at the [facility] in 2005 & tried Topamax + Paxil + Zanaflex...  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 ~  GI by DrPatterson and up to date w/ neg colon in 2002 ~  she takes numerous supplements: Calcium, Magnesium, Vit C, Vit D, CoQ10...    Past Surgical History  Procedure Date  . Cholecystectomy 1988  . Laser surgery for endometriosis 1993    Dr. Jamey Ripa  . Abdominal hysterectomy 1993  . Hemorrhoid surgery 2001    Dr.  Jamey Ripa  . C-5, c-6 & c-6, c-7 anterior cervical disectomy and fusion with allograft and plating 01/2005    Dr. Ophelia Charter    Outpatient Encounter Prescriptions as of 03/13/2011  Medication Sig Dispense Refill  . Calcium Carbonate-Vitamin D (CALCIUM 600+D) 600-400 MG-UNIT per tablet Take 1 tablet by mouth daily.        . cholecalciferol (VITAMIN D) 1000 UNITS tablet Take 1,000 Units by mouth 2 (two) times daily.        . citalopram (CELEXA) 20 MG tablet Take 40 mg by mouth daily.        . Coenzyme Q10 100 MG TABS Take 1 tablet by mouth daily.        Marland Kitchen ibuprofen (ADVIL,MOTRIN) 200 MG tablet Per bottle as needed       . Magnesium 250 MG TABS Take 1 tablet by mouth daily.        . polyethylene glycol (MIRALAX / GLYCOLAX) packet Take 17 g by mouth daily.        Marland Kitchen tiZANidine (ZANAFLEX) 4 MG tablet TAKE ONE-HALF TO ONE TABLET BY MOUTH THREE TIMES DAILY AS DIRECTED  90 tablet  5  . vitamin C (ASCORBIC ACID) 500 MG tablet Take 500 mg by mouth daily.        Marland Kitchen amoxicillin-clavulanate (AUGMENTIN) 875-125 MG per tablet Take 1 tablet by mouth 2 (two) times daily.  14 tablet  0  . pregabalin (LYRICA) 75 MG capsule Take 75 mg by mouth 2 (two) times daily.          Allergies  Allergen Reactions  . Codeine     REACTION: itching--nervous    Current Medications, Allergies, Past Medical History, Past Surgical History, Family History, and Social History were reviewed in [employer] record.   Review of Systems         The patient complains of dyspnea on exertion, back pain, muscle cramps, and arthritis.  The patient denies fever, chills, sweats, anorexia, fatigue, weakness, malaise, weight loss, sleep disorder, blurring, diplopia, eye irritation, eye discharge, vision loss, eye pain, photophobia, earache, ear discharge, tinnitus, decreased hearing, nasal congestion, nosebleeds, sore throat, hoarseness, chest pain, palpitations, syncope, orthopnea, PND, peripheral edema, cough, dyspnea at  rest, excessive sputum, hemoptysis, wheezing, pleurisy, nausea, vomiting, diarrhea, constipation, change in bowel habits, abdominal pain, melena, hematochezia, jaundice, gas/bloating, indigestion/heartburn, dysphagia, odynophagia, dysuria, hematuria, urinary frequency, urinary hesitancy, nocturia, incontinence, joint pain, joint swelling, muscle weakness, stiffness, sciatica, restless legs, leg pain at night, leg pain with exertion, rash, itching, dryness, suspicious lesions, paralysis, paresthesias, seizures, tremors, vertigo, transient blindness, frequent falls, frequent headaches, difficulty walking, depression, anxiety, memory loss, confusion, cold intolerance, heat intolerance, polydipsia, polyphagia, polyuria, unusual weight change, abnormal bruising, bleeding, enlarged lymph nodes, urticaria, allergic rash, hay fever, and recurrent infections.   Objective:   Physical Exam     WD,  Overweight, 55 y/o WF in NAD... GENERAL:  Alert & oriented; pleasant & cooperative... HEENT:  [place]/AT, EOM-wnl, PERRLA, Fundi-benign, EACs-clear, TMs-wnl, NOSE-clear, THROAT-clear & wnl. NECK:  Supple w/ decrROM & scar of surg; no JVD; normal carotid impulses w/o bruits; no thyromegaly or nodules palpated; no lymphadenopathy. CHEST:  Clear to P & A; without wheezes/ rales/ or rhonchi. HEART:  Regular Rhythm; without murmurs/ rubs/ or gallops. ABDOMEN:  Obese, soft & nontender; normal bowel sounds; no organomegaly or masses detected. EXT: without deformities, mild arthritic changes; no varicose veins/ +venous insuffic/ tr edema. NEURO:  CN's intact; motor testing normal; sensory testing normal; gait normal & balance OK. DERM:  No lesions noted; no rash etc...  CXR>  IMPRESSION:  No active lung disease.   Assessment & Plan:   Asthmatic Bronchitis>  She has a refractory episode s/p Augmentin antibiotic RX; suggest Depomedrol & PREDNISONE tapering schedule to reduce her bronchial tube inflamm...  Fibromyalgia>   Stable w/ +trigger points; she never tried the Lyrica...  Anxiety/ Depression>  She wants to try PRISTIQ 50mg /d as rec by her beautician...   HYPERLIPID>  Needs better low chol/ low fat diet & weight reduction;  F/u FLP later...  Obesity>  We discussed calorie restriction, diet & exercise...  GI> GERD, IBS>  Stable on OTC meds as noted...  DJD, DDD, etc>  She has had CSpine surg from [place] in 2006.Marland KitchenMarland Kitchen

## 2011-03-13 NOTE — Patient Instructions (Signed)
Today we updated your med list in EPIC...     For the Asthmatic bronchitis:    We gave you a Depo shot & a new Rx for Prednisone to take twice daily x3d, then once daily x3d, then 1/2 tab daily til gone...    Continue the Mucinex, Fluid, etc...  For the FM/ difficulty resting/ ?depression:    We decided to try the SNRI medication PRISTIQ 50mg  one tab daily, & stop the ineffective SSRI med Celexa for now...  Today we did your follow up CXR...    Please call the [facility]REE in a few days for your results...    Dial [id number] & when prompted enter your patient number followed by the # symbol...    Your patient number is:   [id number]#  Call for any questions.Marland KitchenMarland Kitchen

## 2011-03-16 ENCOUNTER — Encounter: Payer: Self-pay | Admitting: Pulmonary Disease

## 2011-03-18 ENCOUNTER — Telehealth: Payer: Self-pay | Admitting: Pulmonary Disease

## 2011-03-18 MED ORDER — MOXIFLOXACIN HCL 400 MG PO TABS: 400.0000 mg | ORAL_TABLET | Freq: Every day | ORAL | Status: AC

## 2011-03-18 NOTE — Telephone Encounter (Signed)
Spoke with pt and she states she finished her abx last Thursday. Pt states she is still having a cough but now is getting up thick green phlem. Pt is requesting recs on what else to do. Pt is on the 4th day of her prednisone. Please advise Dr. Kriste Basque. Thanks  Allergies  Allergen Reactions  . Codeine     REACTION: itching--nervous     Carver Fila, CMA

## 2011-03-18 NOTE — Telephone Encounter (Signed)
Per SN---cont the pred dosepak and cont the mucinex 2 po bid with fluids---if sputum is still green after completing the augmentin rx then we can call in avelox 400mg   #7  1 daily.  Called and spoke with pt and she is aware of SN recs and she will start on the abx tomorrow.

## 2011-04-15 ENCOUNTER — Telehealth: Payer: Self-pay | Admitting: Pulmonary Disease

## 2011-04-15 MED ORDER — CITALOPRAM HYDROBROMIDE 20 MG PO TABS: 20.0000 mg | ORAL_TABLET | Freq: Two times a day (BID) | ORAL | Status: DC

## 2011-04-15 NOTE — Telephone Encounter (Signed)
Per SN---sounds like intolerant    Stop the pristique and ok to restart the celexa as before.  thanks

## 2011-04-15 NOTE — Telephone Encounter (Signed)
Spoke with pt and notified of recs per SN. New rx for celexa sent per pt request to [facility].

## 2011-04-15 NOTE — Telephone Encounter (Signed)
Spoke with pt. She states that she is having trouble with taking pristique- has noticed some nausea, HA, increased BP, and mild heart palpitations on occ. She states that after reading package insert of med, these are all s/e. She wants to switch back to celexa b/c of s/e and feels pristique not helping with depression symptoms anyway. SN, pls advise, thanks!

## 2011-11-16 ENCOUNTER — Other Ambulatory Visit: Payer: Self-pay | Admitting: Adult Health

## 2012-02-27 ENCOUNTER — Other Ambulatory Visit: Payer: Self-pay | Admitting: Adult Health

## 2012-03-02 ENCOUNTER — Telehealth: Payer: Self-pay | Admitting: Pulmonary Disease

## 2012-03-02 MED ORDER — TIZANIDINE HCL 4 MG PO TABS: ORAL_TABLET | ORAL | Status: DC

## 2012-03-02 NOTE — Telephone Encounter (Signed)
Called, spoke with pt who states she is almost out of tizanidine.  States she is taking 2 1/2 - 3 tablets daily.  Requesting rx asap.  Last OV was 03/13/11 and was asked to f/u in 3 months.  She did schedule a follow up OV for first available on Sept 10 at 2 pm with Dr. Kriste Basque.    Per Leigh, ok to refill the tizanidine for #90 with no additional refills.  Pt must keep appt for any additional refills.  Spoke with pt.  Informed her of rx will be sent to [facility] on [address] but she will need to keep appt for any additional refills.  She verbalized understanding of this and voiced no further questions/concerns at this time.

## 2012-03-30 ENCOUNTER — Encounter: Payer: Self-pay | Admitting: Pulmonary Disease

## 2012-03-30 ENCOUNTER — Ambulatory Visit (INDEPENDENT_AMBULATORY_CARE_PROVIDER_SITE_OTHER): Payer: 59 | Admitting: Pulmonary Disease

## 2012-03-30 VITALS — BP 138/82 | HR 79 | Temp 98.5°F | Ht 68.0 in | Wt 271.8 lb

## 2012-03-30 DIAGNOSIS — R002 Palpitations: Secondary | ICD-10-CM

## 2012-03-30 DIAGNOSIS — K589 Irritable bowel syndrome without diarrhea: Secondary | ICD-10-CM

## 2012-03-30 DIAGNOSIS — F419 Anxiety disorder, unspecified: Secondary | ICD-10-CM

## 2012-03-30 DIAGNOSIS — IMO0001 Reserved for inherently not codable concepts without codable children: Secondary | ICD-10-CM

## 2012-03-30 DIAGNOSIS — K219 Gastro-esophageal reflux disease without esophagitis: Secondary | ICD-10-CM

## 2012-03-30 DIAGNOSIS — G43909 Migraine, unspecified, not intractable, without status migrainosus: Secondary | ICD-10-CM

## 2012-03-30 DIAGNOSIS — E785 Hyperlipidemia, unspecified: Secondary | ICD-10-CM

## 2012-03-30 DIAGNOSIS — I872 Venous insufficiency (chronic) (peripheral): Secondary | ICD-10-CM

## 2012-03-30 DIAGNOSIS — Z23 Encounter for immunization: Secondary | ICD-10-CM

## 2012-03-30 DIAGNOSIS — E669 Obesity, unspecified: Secondary | ICD-10-CM

## 2012-03-30 DIAGNOSIS — M5030 Other cervical disc degeneration, unspecified cervical region: Secondary | ICD-10-CM

## 2012-03-30 MED ORDER — CITALOPRAM HYDROBROMIDE 20 MG PO TABS: 20.0000 mg | ORAL_TABLET | Freq: Two times a day (BID) | ORAL | Status: DC

## 2012-03-30 MED ORDER — TIZANIDINE HCL 4 MG PO TABS: ORAL_TABLET | ORAL | Status: DC

## 2012-03-30 NOTE — Patient Instructions (Addendum)
Today we updated your med list in our EPIC system...    Continue your current medications the same...    We refilled your meds per request...  We gave you the 2013 Flu vaccine today...  Try to avoid caffeine, & stimulant meds like Pseudophed or Phenylephrine as they can make your heart palpitate...  Call for any questions...  Let's plan a follow up physical at your convenience w/ CXR, EKG & FASTING blood work at that time.Marland KitchenMarland Kitchen

## 2012-03-30 NOTE — Progress Notes (Signed)
Subjective:    Patient ID: Sheila Dawson, female    DOB: [birth year], 55 y.o.   MRN: [id number]  HPI 56 y/o WF here for a follow up visit & CPX... she has multiple medical problems as noted below...  ~  July 05, 2010:  10mo ROV- c/o sinus congestion, drainage, HA, sneezing, etc> using Tylenol sinus, Mucinex, Fluids, Saline, Allegra, Flonase, etc... her GYN is DrRevard w/ abn PAP- precancerous cells yrs ago & Cx frozen then;  had another abn PAP recently & told f/u 9yr but she is uncomfortable & wants me to review the report, offered second opin consult but she decines...  she also c/o continue neck & back discomfort- on Ibuprofen, rest, heat, & want Lyrica trial- OK... refills for 90d requested.  ~  March 13, 2011:  35mo ROV & add-on for month long resp tract illness characterized by sore throat, cough, scant sput production, intermittent pleuritic CP, ?fever, +sweats, & some incr SOB> she had Augmentin called in but symptoms linger & we discussed Asthmatic Bronchitis & the need for Depo & Prednisone course to reduce the bronchitic inflammation...  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   ~  March 30, 2012:  Yearly ROV & [name] reports doing well in general & denies new complaints or concerns;  She has been active, recent vacation, & FM flair;  She is concerned about her appetite (too good, wt noted to be up 8# to 272#);  Notes occas palpit, "flip flop", improves w/ cough, but she declines f/u EKG or Cards referral- we reviewed no caffeine etc;  She is not fasting today for labs and wants to wait to sched a CPX...    We reviewed prob list, meds, xrays and labs> see below >> OK 2013 Flu vaccine today, & requests refills for 90d supplies...           PROBLEM LIST:  ASTHMATIC BRONCHITIS, ACUTE (ICD-466.0) - never smoker, occas episodes of URI's w/ congestion  and intermittent wheezing... ~  CXR 7/10 showed prev neck surg, clear lungs, NAD.Marland Kitchen. ~  8/12:  Presents w/ refractory AB episode> given Augmentin as outpt, then OV w/ Depo/ Pred taper... ~  CXR 8/12 showed clear lungs, borderline heart size, neck surg fusion plates, clips RUQ from prev GB surg...  VENOUS INSUFFICIENCY (ICD-459.81) - she knows to avoid sodium, elevate legs, wear support hose, etc... she wants to see a vein doctor, she says.  HYPERLIPIDEMIA, MILD (ICD-272.4) - she has a mild mixed hyperlipidemia- diet rx & weight reduction... ~  FLP 7/10 showed TChol 180, TG 164, HDL 40, LDL 107... rec> get wt down. ~  FLP 12/11 showed TChol 197, TG 183, HDL 40, LDL 121... needs low chol/ low fat diet.  OBESITY (ICD-278.00) - she has been counseled on diet, exercise, etc... ~  weights in the 2000's betw 244-284# ~  weight 12/08 = 279# ~  weight 7/10 = 273# ~  weight 12/11 = 265# ~  weight 8/12 = 265# ~  Weight 9/13 = 272#... She is concerned about her appetite & we discussed Bariatric Surg as a means of controlling her appetite.  GERD (ICD-530.81) - treated w/ OTC PPI or H2Blockers Prn... ~  she had an EGD by DrPatterson in 2000 that was essentially WNL.Marland KitchenMarland Kitchen  IRRITABLE BOWEL SYNDROME (ICD-564.1) - w/ constipation and hx hemorroids... on MIRALAX daily... ~  colonoscopy 2/02 by DrPatterson was neg- f/u planned 68yrs...  DEGENERATIVE DISC DISEASE, CERVICAL SPINE (ICD-722.4) - hx DDD w/ pain to right shoulder leading to CSpine fusion by DrYates in 2006... ~  s/p C5-C6 & C6-C7 anterior cervical discectomy & fusion w/  allograft & plating- 7/06 by DrYates...  FIBROMYALGIA (ICD-729.1) - on ZANAFLEX 4mg  Tid Prn & CITALOPRAM 20mg - 2tabs daily for pain... she had Rheum eval in the 1990's by DrTruslow, & DrDeveshwar in 2003... she's also had bursitis w/ injections, and a pos ANA... ~  We prev rec trial Lyrica but pt never filled this Rx... 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  MIGRAINE HEADACHE (ICD-346.90) - she [facility]eman [facility] the [facility] in [facility] & tried Topamax + Paxil + Zanaflex... ~  9/13:  Doing satis & notes occas sinus HAs that respond to Tylenol & OTC rx...  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 ~  GYN= DrRevard now w/ hx endometriosis, & abn PAP as noted. ~  GI by DrPatterson and up to date w/ neg colon in 2002 ~  she takes numerous supplements: Calcium, Magnesium, Vit C, Vit D, CoQ10...    Past Surgical History  Procedure Date  . Cholecystectomy 1988  . Laser surgery for endometriosis 1993    Dr. Jamey Ripa  . Abdominal hysterectomy 1993  . Hemorrhoid surgery 2001    Dr. Jamey Ripa  . C-5, c-6 & c-6, c-7 anterior cervical disectomy and fusion with allograft and plating 01/2005    Dr. Ophelia Charter    Outpatient Encounter Prescriptions as of 03/30/2012  Medication Sig Dispense Refill  . aspirin 81 MG tablet Take 81 mg by mouth 2 (two) times daily.      . beta carotene w/minerals (OCUVITE) tablet Take 1 tablet by mouth daily.      . Calcium Carbonate-Vitamin D (CALCIUM 600+D) 600-400 MG-UNIT per tablet Take 1 tablet by mouth daily.        . cholecalciferol (VITAMIN D) 1000 UNITS tablet Take 1,000 Units by mouth 2 (two) times daily.        . citalopram (CELEXA) 20 MG tablet Take 1 tablet (20 mg total) by mouth 2 (two) times daily.  60 tablet  11  . Coenzyme Q10 100 MG TABS Take 1 tablet by mouth daily.        . fish oil-omega-3 fatty acids 1000 MG capsule Take 2 g by mouth 2 (two) times daily.      Marland Kitchen ibuprofen (ADVIL,MOTRIN) 200 MG tablet Per bottle as needed       . Magnesium 250 MG TABS Take 1 tablet by mouth daily.        .  polyethylene glycol (MIRALAX / GLYCOLAX) packet Take 17 g by mouth daily.        Marland Kitchen tiZANidine (ZANAFLEX) 4 MG tablet TAKE ONE-HALF TO ONE TABLET BY MOUTH THREE TIMES DAILY AS DIRECTED.  90 tablet  0  . vitamin C (ASCORBIC ACID) 500 MG tablet Take 500 mg by mouth daily.        Marland Kitchen DISCONTD: desvenlafaxine (PRISTIQ) 50 MG 24 hr tablet Take 1 tablet (50 mg total)  by mouth daily.  30 tablet  5  . DISCONTD: predniSONE (DELTASONE) 20 MG tablet Take 1 tablet by mouth two times daily x 3 days, 1 tablet daily x 3 days, 1/2 tablet by mouth until gone  12 tablet  0  . DISCONTD: pregabalin (LYRICA) 75 MG capsule Take 75 mg by mouth 2 (two) times daily.          Allergies  Allergen Reactions  . Codeine     REACTION: itching--nervous  . Pristiq (Desvenlafaxine)     Nausea, headahce, increased BP and palpitations    Current Medications, Allergies, Past Medical History, Past Surgical History, Family History, and Social History were reviewed in [employer] record.   Review of Systems         The patient complains of dyspnea on exertion, back pain, muscle cramps, and arthritis.  The patient denies fever, chills, sweats, anorexia, fatigue, weakness, malaise, weight loss, sleep disorder, blurring, diplopia, eye irritation, eye discharge, vision loss, eye pain, photophobia, earache, ear discharge, tinnitus, decreased hearing, nasal congestion, nosebleeds, sore throat, hoarseness, chest pain, palpitations, syncope, orthopnea, PND, peripheral edema, cough, dyspnea at rest, excessive sputum, hemoptysis, wheezing, pleurisy, nausea, vomiting, diarrhea, constipation, change in bowel habits, abdominal pain, melena, hematochezia, jaundice, gas/bloating, indigestion/heartburn, dysphagia, odynophagia, dysuria, hematuria, urinary frequency, urinary hesitancy, nocturia, incontinence, joint pain, joint swelling, muscle weakness, stiffness, sciatica, restless legs, leg pain at night, leg pain with exertion,  rash, itching, dryness, suspicious lesions, paralysis, paresthesias, seizures, tremors, vertigo, transient blindness, frequent falls, frequent headaches, difficulty walking, depression, anxiety, memory loss, confusion, cold intolerance, heat intolerance, polydipsia, polyphagia, polyuria, unusual weight change, abnormal bruising, bleeding, enlarged lymph nodes, urticaria, allergic rash, hay fever, and recurrent infections.   Objective:   Physical Exam     WD, Overweight, 56 y/o WF in NAD... GENERAL:  Alert & oriented; pleasant & cooperative... HEENT:  [place]/AT, EOM-wnl, PERRLA, Fundi-benign, EACs-clear, TMs-wnl, NOSE-clear, THROAT-clear & wnl. NECK:  Supple w/ decrROM & scar of surg; no JVD; normal carotid impulses w/o bruits; no thyromegaly or nodules palpated; no lymphadenopathy. CHEST:  Clear to P & A; without wheezes/ rales/ or rhonchi. HEART:  Regular Rhythm; without murmurs/ rubs/ or gallops. ABDOMEN:  Obese, soft & nontender; normal bowel sounds; no organomegaly or masses detected. EXT: without deformities, mild arthritic changes; no varicose veins/ +venous insuffic/ tr edema. NEURO:  CN's intact; motor testing normal; sensory testing normal; gait normal & balance OK. DERM:  No lesions noted; no rash etc...  RADIOLOGY DATA:  Reviewed in the EPIC EMR & discussed w/ the patient...  LABORATORY DATA:  Reviewed in the EPIC EMR & discussed w/ the patient...   Assessment & Plan:    Asthmatic Bronchitis>  Her breathing has been good, no recent resp exac, not requiring regular meds...  HYPERLIPID>  Needs better low chol/ low fat diet & weight reduction;  F/u FLP later...  Obesity>  We discussed calorie restriction, diet & exercise...  GI> GERD, IBS>  Stable on OTC meds as noted...  DJD, DDD, etc>  She has had CSpine surg from [place] in 2006...  Fibromyalgia>  Stable w/ +trigger points; she never tried the Lyrica...  Anxiety/ Depression>  She switched from Celexa to Pristiq & back, she  likes the Celexa Rx...   Patient's Medications  New Prescriptions   No medications on file  Previous Medications   ASPIRIN 81 MG TABLET    Take 81 mg by mouth 2 (two) times daily.   BETA CAROTENE W/MINERALS (OCUVITE)  TABLET    Take 1 tablet by mouth daily.   CALCIUM CARBONATE-VITAMIN D (CALCIUM 600+D) 600-400 MG-UNIT PER TABLET    Take 1 tablet by mouth daily.     CHOLECALCIFEROL (VITAMIN D) 1000 UNITS TABLET    Take 1,000 Units by mouth 2 (two) times daily.     COENZYME Q10 100 MG TABS    Take 1 tablet by mouth daily.     FISH OIL-OMEGA-3 FATTY ACIDS 1000 MG CAPSULE    Take 2 g by mouth 2 (two) times daily.   IBUPROFEN (ADVIL,MOTRIN) 200 MG TABLET    Per bottle as needed    MAGNESIUM 250 MG TABS    Take 1 tablet by mouth daily.     POLYETHYLENE GLYCOL (MIRALAX / GLYCOLAX) PACKET    Take 17 g by mouth daily.     VITAMIN C (ASCORBIC ACID) 500 MG TABLET    Take 500 mg by mouth daily.    Modified Medications   Modified Medication Previous Medication   CITALOPRAM (CELEXA) 20 MG TABLET citalopram (CELEXA) 20 MG tablet      Take 1 tablet (20 mg total) by mouth 2 (two) times daily.    Take 1 tablet (20 mg total) by mouth 2 (two) times daily.   TIZANIDINE (ZANAFLEX) 4 MG TABLET tiZANidine (ZANAFLEX) 4 MG tablet      TAKE ONE-HALF TO ONE TABLET BY MOUTH THREE TIMES DAILY AS DIRECTED.    TAKE ONE-HALF TO ONE TABLET BY MOUTH THREE TIMES DAILY AS DIRECTED.  Discontinued Medications   DESVENLAFAXINE (PRISTIQ) 50 MG 24 HR TABLET    Take 1 tablet (50 mg total) by mouth daily.   PREDNISONE (DELTASONE) 20 MG TABLET    Take 1 tablet by mouth two times daily x 3 days, 1 tablet daily x 3 days, 1/2 tablet by mouth until gone   PREGABALIN (LYRICA) 75 MG CAPSULE    Take 75 mg by mouth 2 (two) times daily.

## 2012-05-06 ENCOUNTER — Encounter: Payer: Self-pay | Admitting: *Deleted

## 2012-05-07 ENCOUNTER — Ambulatory Visit (INDEPENDENT_AMBULATORY_CARE_PROVIDER_SITE_OTHER): Payer: 59 | Admitting: Pulmonary Disease

## 2012-05-07 ENCOUNTER — Encounter: Payer: Self-pay | Admitting: Pulmonary Disease

## 2012-05-07 ENCOUNTER — Other Ambulatory Visit (INDEPENDENT_AMBULATORY_CARE_PROVIDER_SITE_OTHER): Payer: 59

## 2012-05-07 VITALS — BP 110/70 | HR 63 | Temp 97.3°F | Ht 67.0 in | Wt 269.2 lb

## 2012-05-07 DIAGNOSIS — E669 Obesity, unspecified: Secondary | ICD-10-CM

## 2012-05-07 DIAGNOSIS — I872 Venous insufficiency (chronic) (peripheral): Secondary | ICD-10-CM

## 2012-05-07 DIAGNOSIS — M5030 Other cervical disc degeneration, unspecified cervical region: Secondary | ICD-10-CM

## 2012-05-07 DIAGNOSIS — J209 Acute bronchitis, unspecified: Secondary | ICD-10-CM

## 2012-05-07 DIAGNOSIS — Z0000 Encounter for general adult medical examination without abnormal findings: Secondary | ICD-10-CM

## 2012-05-07 DIAGNOSIS — E785 Hyperlipidemia, unspecified: Secondary | ICD-10-CM

## 2012-05-07 DIAGNOSIS — K219 Gastro-esophageal reflux disease without esophagitis: Secondary | ICD-10-CM

## 2012-05-07 DIAGNOSIS — F329 Major depressive disorder, single episode, unspecified: Secondary | ICD-10-CM

## 2012-05-07 DIAGNOSIS — G43909 Migraine, unspecified, not intractable, without status migrainosus: Secondary | ICD-10-CM

## 2012-05-07 DIAGNOSIS — IMO0001 Reserved for inherently not codable concepts without codable children: Secondary | ICD-10-CM

## 2012-05-07 DIAGNOSIS — K589 Irritable bowel syndrome without diarrhea: Secondary | ICD-10-CM

## 2012-05-07 LAB — CBC WITH DIFFERENTIAL/PLATELET
Basophils Absolute: 0.1 10*3/uL (ref 0.0–0.1)
Lymphocytes Relative: 29.6 % (ref 12.0–46.0)
Lymphs Abs: 3 10*3/uL (ref 0.7–4.0)
Monocytes Relative: 8.6 % (ref 3.0–12.0)
Platelets: 283 10*3/uL (ref 150.0–400.0)
RDW: 13.7 % (ref 11.5–14.6)

## 2012-05-07 LAB — LIPID PANEL: HDL: 43.6 mg/dL (ref >39.00)

## 2012-05-07 LAB — TSH: TSH: 1.21 u[IU]/mL (ref 0.35–5.50)

## 2012-05-07 LAB — HEPATIC FUNCTION PANEL
ALT: 17 U/L (ref 0–35)
AST: 20 U/L (ref 0–37)
Albumin: 4 g/dL (ref 3.5–5.2)
Alkaline Phosphatase: 69 U/L (ref 39–117)
Total Protein: 7.6 g/dL (ref 6.0–8.3)

## 2012-05-07 LAB — BASIC METABOLIC PANEL
CO2: 31 mEq/L (ref 19–32)
Glucose, Bld: 89 mg/dL (ref 70–99)
Potassium: 4.2 mEq/L (ref 3.5–5.1)
Sodium: 140 mEq/L (ref 135–145)

## 2012-05-07 LAB — URINALYSIS
Hgb urine dipstick: NEGATIVE
Ketones, ur: NEGATIVE
Leukocytes, UA: NEGATIVE
Specific Gravity, Urine: 1.015 (ref 1.000–1.030)
Urobilinogen, UA: 0.2 (ref 0.0–1.0)

## 2012-05-07 MED ORDER — TIZANIDINE HCL 4 MG PO TABS: ORAL_TABLET | ORAL | Status: DC

## 2012-05-07 MED ORDER — LEVALBUTEROL TARTRATE 45 MCG/ACT IN AERO: 1.0000 | INHALATION_SPRAY | Freq: Four times a day (QID) | RESPIRATORY_TRACT | Status: DC | PRN

## 2012-05-07 NOTE — Progress Notes (Signed)
Subjective:    Patient ID: Sheila Dawson, female    DOB: 01/05/1956, 56 y.o.   MRN: [id number]  HPI 56 y/o WF here for a follow up visit & CPX... she has multiple medical problems as noted below...  ~  July 05, 2010:  61mo ROV- c/o sinus congestion, drainage, HA, sneezing, etc> using Tylenol sinus, Mucinex, Fluids, Saline, Allegra, Flonase, etc... her GYN is DrRevard w/ abn PAP- precancerous cells yrs ago & Cx frozen then;  had another abn PAP recently & told f/u 25yr but she is uncomfortable & wants me to review the report, offered second opin consult but she decines...  she also c/o continue neck & back discomfort- on Ibuprofen, rest, heat, & want Lyrica trial- OK... refills for 90d requested.  ~  March 13, 2011:  59mo ROV & add-on for month long resp tract illness characterized by sore throat, cough, scant sput production, intermittent pleuritic CP, ?fever, +sweats, & some incr SOB> she had Augmentin called in but symptoms linger & we discussed Asthmatic Bronchitis & the need for Depo & Prednisone course to reduce the bronchitic inflammation...  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   ~  March 30, 2012:  Yearly ROV & [name] reports doing well in general & denies new complaints or concerns;  She has been active, recent vacation, & FM flair;  She is concerned about her appetite (too good, wt noted to be up 8# to 272#);  Notes occas palpit, "flip flop", improves w/ cough, but she declines f/u EKG or Cards referral- we reviewed no caffeine etc;  She is not fasting today for labs and wants to wait to sched a CPX...    We reviewed prob list, meds, xrays and labs> see below >> OK 2013 Flu vaccine today, & requests refills for 90d supplies...  ~  May 07, 2012:  Follow up visit & CPX>     AB> on Mucinex prn; breathing is ok but notes some "congestion" & wants  inhaler- we discussed XopenexHFA...    VI>  notes intermittent edema; we reviewed no salt, elevation, support hose, etc...    Chol> on Diet, CoQ10, Fish Oil; FLP isn't bad w/ TChol 184, TG 165, HDL 44, LDL 107; needs better diet, low fat, get wt down...    Obese> her wt is down 3# to 269# but BMI ~41; we reviewed diet, exercise, wt reduction strategies...    GI> GERD, IBS> note occas reflux but relates it to certain foods; uses Miralax which helps...    Ortho> DDD, FM> on Advil prn & Zanaflex 4mg  tid; "my muscles are bad" "I hurt all the time" c/o neck shoulders, legs & back "it's the FM"; she has seen Ortho & rheum in the past but wants another Rheum consult- wants to see [facility]...    Anxiety/ Depression> on Celexa20Bid; prev intol to Pristiq We reviewed prob list, meds, xrays and labs> see below for updates >>  EKG 10/13 showed NSR, rate65, WNL, NAD... LABS 10/13:  FLP- fair on diet/FishOil w/ TG=165 LDL107;  Chems- wnl;  CBC- wnl;  TSH=1.21;  UA- clear...          PROBLEM LIST:  ASTHMATIC BRONCHITIS, ACUTE (ICD-466.0) - never smoker, occas episodes of URI's w/ congestion and intermittent wheezing... ~  CXR 7/10 showed prev neck surg, clear lungs, NAD.Marland Kitchen. ~  8/12:  Presents w/ refractory AB episode> given Augmentin as outpt, then OV w/ Depo/ Pred taper... ~  CXR 8/12 showed clear lungs, borderline heart size, neck surg fusion plates, clips RUQ from prev GB surg...  VENOUS INSUFFICIENCY (ICD-459.81) - she knows to avoid sodium, elevate legs, wear support hose, etc... she wants to see a vein doctor, she says.  HYPERLIPIDEMIA, MILD (ICD-272.4) - she has a mild mixed hyperlipidemia- diet rx & weight reduction... ~  FLP 7/10 showed TChol 180, TG 164, HDL 40, LDL 107... rec> get wt down. ~  FLP 12/11 showed TChol 197, TG 183, HDL 40, LDL 121... needs low chol/ low fat diet. ~  FLP 10/13 on diet alone showed TChol 184, TG 165, HDL 44, LDL 107  OBESITY (XBJ-478.29) - she has been counseled on  diet, exercise, etc... ~  weights in the 2000's betw 244-284# ~  weight 12/08 = 279# ~  weight 7/10 = 273# ~  weight 12/11 = 265# ~  weight 8/12 = 265# ~  Weight 9/13 = 272#... She is concerned about her appetite & we discussed Bariatric Surg as a means of controlling her appetite.  GERD (ICD-530.81) - treated w/ OTC PPI or H2Blockers Prn... ~  she had an EGD by DrPatterson in 2000 that was essentially WNL... ~  10/13:  She relates reflux symptoms to certain foods she eats...  IRRITABLE BOWEL SYNDROME (ICD-564.1) - w/ constipation and hx hemorroids... on MIRALAX daily... ~  colonoscopy 2/02 by DrPatterson was neg- f/u planned 51yrs...  DEGENERATIVE DISC DISEASE, CERVICAL SPINE (ICD-722.4) - hx DDD w/ pain to right shoulder leading to CSpine fusion by DrYates in 2006... ~  s/p C5-C6 & C6-C7 anterior cervical discectomy & fusion w/  allograft & plating- 7/06 by DrYates...  FIBROMYALGIA (ICD-729.1) - on ZANAFLEX 4mg  Tid Prn & CITALOPRAM 20mg - 2tabs daily for pain... she had Rheum eval in the 1990's by DrTruslow, & DrDeveshwar in 2003... she's also had bursitis w/ injections, and a pos ANA... ~  We prev rec trial Lyrica but pt never filled this Rx... 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 ~  10/13: she is requesting a Rheum Consult w/ DrHawkes to see if she can help...  MIGRAINE HEADACHE (ICD-346.90) - she saw [facility]Freeman at the [facility] in 2005 & tried Topamax + Paxil + Zanaflex... ~  9/13:  Doing satis & notes occas sinus HAs that respond to Tylenol & OTC rx...  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 ~   GYN= DrRevard now w/ hx endometriosis, & abn PAP as noted. ~  GI by DrPatterson and up to date w/ neg colon in 2002 ~  she takes numerous supplements: Calcium, Magnesium, Vit C, Vit D, CoQ10...    Past Surgical History  Procedure Date  . Cholecystectomy 1988  . Laser surgery for endometriosis 1993    Dr. Jamey Ripa  . Abdominal hysterectomy 1993  . Hemorrhoid surgery 2001    Dr. Jamey Ripa  . C-5, c-6 & c-6, c-7 anterior cervical disectomy and fusion with allograft and plating 01/2005    Dr. Ophelia Charter  Outpatient Encounter Prescriptions as of 05/07/2012  Medication Sig Dispense Refill  . aspirin 81 MG tablet Take 81 mg by mouth 2 (two) times daily.      . beta carotene w/minerals (OCUVITE) tablet Take 1 tablet by mouth daily.      . Calcium Carbonate-Vitamin D (CALCIUM 600+D) 600-400 MG-UNIT per tablet Take 1 tablet by mouth daily.        . cholecalciferol (VITAMIN D) 1000 UNITS tablet Take 1,000 Units by mouth 2 (two) times daily.        . citalopram (CELEXA) 20 MG tablet Take 1 tablet (20 mg total) by mouth 2 (two) times daily.  180 tablet  3  . Coenzyme Q10 100 MG TABS Take 1 tablet by mouth daily.        . fish oil-omega-3 fatty acids 1000 MG capsule Take 2 g by mouth 2 (two) times daily.      Marland Kitchen guaiFENesin (MUCINEX) 600 MG 12 hr tablet Take 600 mg by mouth daily.      Marland Kitchen ibuprofen (ADVIL,MOTRIN) 200 MG tablet Per bottle as needed       . Magnesium 250 MG TABS Take 1 tablet by mouth daily.        . polyethylene glycol (MIRALAX / GLYCOLAX) packet Take 17 g by mouth daily.        . Pseudoephedrine-Acetaminophen (SINUS RELIEF PO) Take by mouth as needed.      Marland Kitchen tiZANidine (ZANAFLEX) 4 MG tablet TAKE ONE-HALF TO ONE TABLET BY MOUTH THREE TIMES DAILY AS DIRECTED.  90 tablet  3  . vitamin C (ASCORBIC ACID) 500 MG tablet Take 500 mg by mouth daily.          Allergies  Allergen Reactions  . Codeine     REACTION: itching--nervous  . Pristiq (Desvenlafaxine)     Nausea, headahce, increased BP  and palpitations    Current Medications, Allergies, Past Medical History, Past Surgical History, Family History, and Social History were reviewed in [employer] record.   Review of Systems         The patient complains of dyspnea on exertion, back pain, muscle cramps, and arthritis.  The patient denies fever, chills, sweats, anorexia, fatigue, weakness, malaise, weight loss, sleep disorder, blurring, diplopia, eye irritation, eye discharge, vision loss, eye pain, photophobia, earache, ear discharge, tinnitus, decreased hearing, nasal congestion, nosebleeds, sore throat, hoarseness, chest pain, palpitations, syncope, orthopnea, PND, peripheral edema, cough, dyspnea at rest, excessive sputum, hemoptysis, wheezing, pleurisy, nausea, vomiting, diarrhea, constipation, change in bowel habits, abdominal pain, melena, hematochezia, jaundice, gas/bloating, indigestion/heartburn, dysphagia, odynophagia, dysuria, hematuria, urinary frequency, urinary hesitancy, nocturia, incontinence, joint pain, joint swelling, muscle weakness, stiffness, sciatica, restless legs, leg pain at night, leg pain with exertion, rash, itching, dryness, suspicious lesions, paralysis, paresthesias, seizures, tremors, vertigo, transient blindness, frequent falls, frequent headaches, difficulty walking, depression, anxiety, memory loss, confusion, cold intolerance, heat intolerance, polydipsia, polyphagia, polyuria, unusual weight change, abnormal bruising, bleeding, enlarged lymph nodes, urticaria, allergic rash, hay fever, and recurrent infections.   Objective:   Physical Exam     WD, Overweight, 56 y/o WF in NAD... GENERAL:  Alert & oriented; pleasant & cooperative... HEENT:  [place]/AT, EOM-wnl, PERRLA, Fundi-benign, EACs-clear, TMs-wnl, NOSE-clear, THROAT-clear & wnl. NECK:  Supple w/ decrROM & scar of surg; no JVD; normal carotid impulses w/o bruits; no thyromegaly or nodules palpated; no lymphadenopathy. CHEST:   Clear to P & A; without wheezes/ rales/ or rhonchi. HEART:  Regular Rhythm; without murmurs/ rubs/ or  gallops. ABDOMEN:  Obese, soft & nontender; normal bowel sounds; no organomegaly or masses detected. EXT: without deformities, mild arthritic changes; no varicose veins/ +venous insuffic/ tr edema. NEURO:  CN's intact; motor testing normal; sensory testing normal; gait normal & balance OK. DERM:  No lesions noted; no rash etc...  RADIOLOGY DATA:  Reviewed in the EPIC EMR & discussed w/ the patient...  LABORATORY DATA:  Reviewed in the EPIC EMR & discussed w/ the patient...   Assessment & Plan:    Asthmatic Bronchitis>  Her breathing has been OK, but she notes some "congestion" 7 wants inhaler- rec XOPENEX HFA.  HYPERLIPID>  Needs better low chol/ low fat diet & weight reduction; f/u FLP reviewed & wt reduction is the key...  Obesity>  We discussed calorie restriction, diet & exercise; consider Bariatic surg w/ BMI=41...  GI> GERD, IBS>  Stable on OTC meds as noted...  DJD, DDD, etc>  She has had CSpine surg from [facility] in 2006...  Fibromyalgia>  Stable w/ +trigger points; she never tried the Lyrica; requests rheum consult w/ DrHawkes...  Anxiety/ Depression>  She switched from Celexa to Pristiq & back, she likes the Celexa Rx...   Patient's Medications  New Prescriptions   LEVALBUTEROL (XOPENEX HFA) 45 MCG/ACT INHALER    Inhale 1-2 puffs into the lungs every 6 (six) hours as needed for wheezing.  Previous Medications   ASPIRIN 81 MG TABLET    Take 81 mg by mouth 2 (two) times daily.   BETA CAROTENE W/MINERALS (OCUVITE) TABLET    Take 1 tablet by mouth daily.   CALCIUM CARBONATE-VITAMIN D (CALCIUM 600+D) 600-400 MG-UNIT PER TABLET    Take 1 tablet by mouth daily.     CHOLECALCIFEROL (VITAMIN D) 1000 UNITS TABLET    Take 1,000 Units by mouth 2 (two) times daily.     CITALOPRAM (CELEXA) 20 MG TABLET    Take 1 tablet (20 mg total) by mouth 2 (two) times daily.   COENZYME Q10 100  MG TABS    Take 1 tablet by mouth daily.     FISH OIL-OMEGA-3 FATTY ACIDS 1000 MG CAPSULE    Take 2 g by mouth 2 (two) times daily.   GUAIFENESIN (MUCINEX) 600 MG 12 HR TABLET    Take 600 mg by mouth daily.   IBUPROFEN (ADVIL,MOTRIN) 200 MG TABLET    Per bottle as needed    MAGNESIUM 250 MG TABS    Take 1 tablet by mouth daily.     POLYETHYLENE GLYCOL (MIRALAX / GLYCOLAX) PACKET    Take 17 g by mouth daily.     PSEUDOEPHEDRINE-ACETAMINOPHEN (SINUS RELIEF PO)    Take by mouth as needed.   VITAMIN C (ASCORBIC ACID) 500 MG TABLET    Take 500 mg by mouth daily.    Modified Medications   Modified Medication Previous Medication   TIZANIDINE (ZANAFLEX) 4 MG TABLET tiZANidine (ZANAFLEX) 4 MG tablet      TAKE  ONE TABLET BY MOUTH THREE TIMES DAILY AS DIRECTED.    TAKE ONE-HALF TO ONE TABLET BY MOUTH THREE TIMES DAILY AS DIRECTED.  Discontinued Medications   No medications on file

## 2012-05-07 NOTE — Patient Instructions (Addendum)
Today we updated your med list in our EPIC system...    Continue your current medications the same...    We refilled your meds per request...  We wrote for a new inhaler- XOPENEX- inhale 1-2 puffs every 6H as needed for wheezing...  Today we did your follow up CXR, EKG, & FASTING blood work...    We will communicate the results to you when avail...  We will also set up a referral to [facility], Rheum for further recommendations regarding your Fibromyalgia...  Call for any questions...  Let's plan a recheck in 6 months, sooner if needed for any problems.Marland KitchenMarland Kitchen

## 2012-05-25 ENCOUNTER — Ambulatory Visit
Admission: RE | Admit: 2012-05-25 | Discharge: 2012-05-25 | Disposition: A | Discharge status: Home / Self Care | Payer: PRIVATE HEALTH INSURANCE | Source: Ambulatory Visit | Attending: Pulmonary Disease | Admitting: Pulmonary Disease

## 2012-05-25 DIAGNOSIS — Z0000 Encounter for general adult medical examination without abnormal findings: Secondary | ICD-10-CM

## 2012-05-25 DIAGNOSIS — J209 Acute bronchitis, unspecified: Secondary | ICD-10-CM

## 2012-07-17 IMAGING — CR DG CHEST 2V
2 series · 2 of 2 positions shown · non-contrast
Comparison: Chest x-ray of 02/16/2009

CLINICAL DATA: Asthmatic bronchitis, cough

CHEST - 2 VIEW

[view not recorded (1 of 2)]
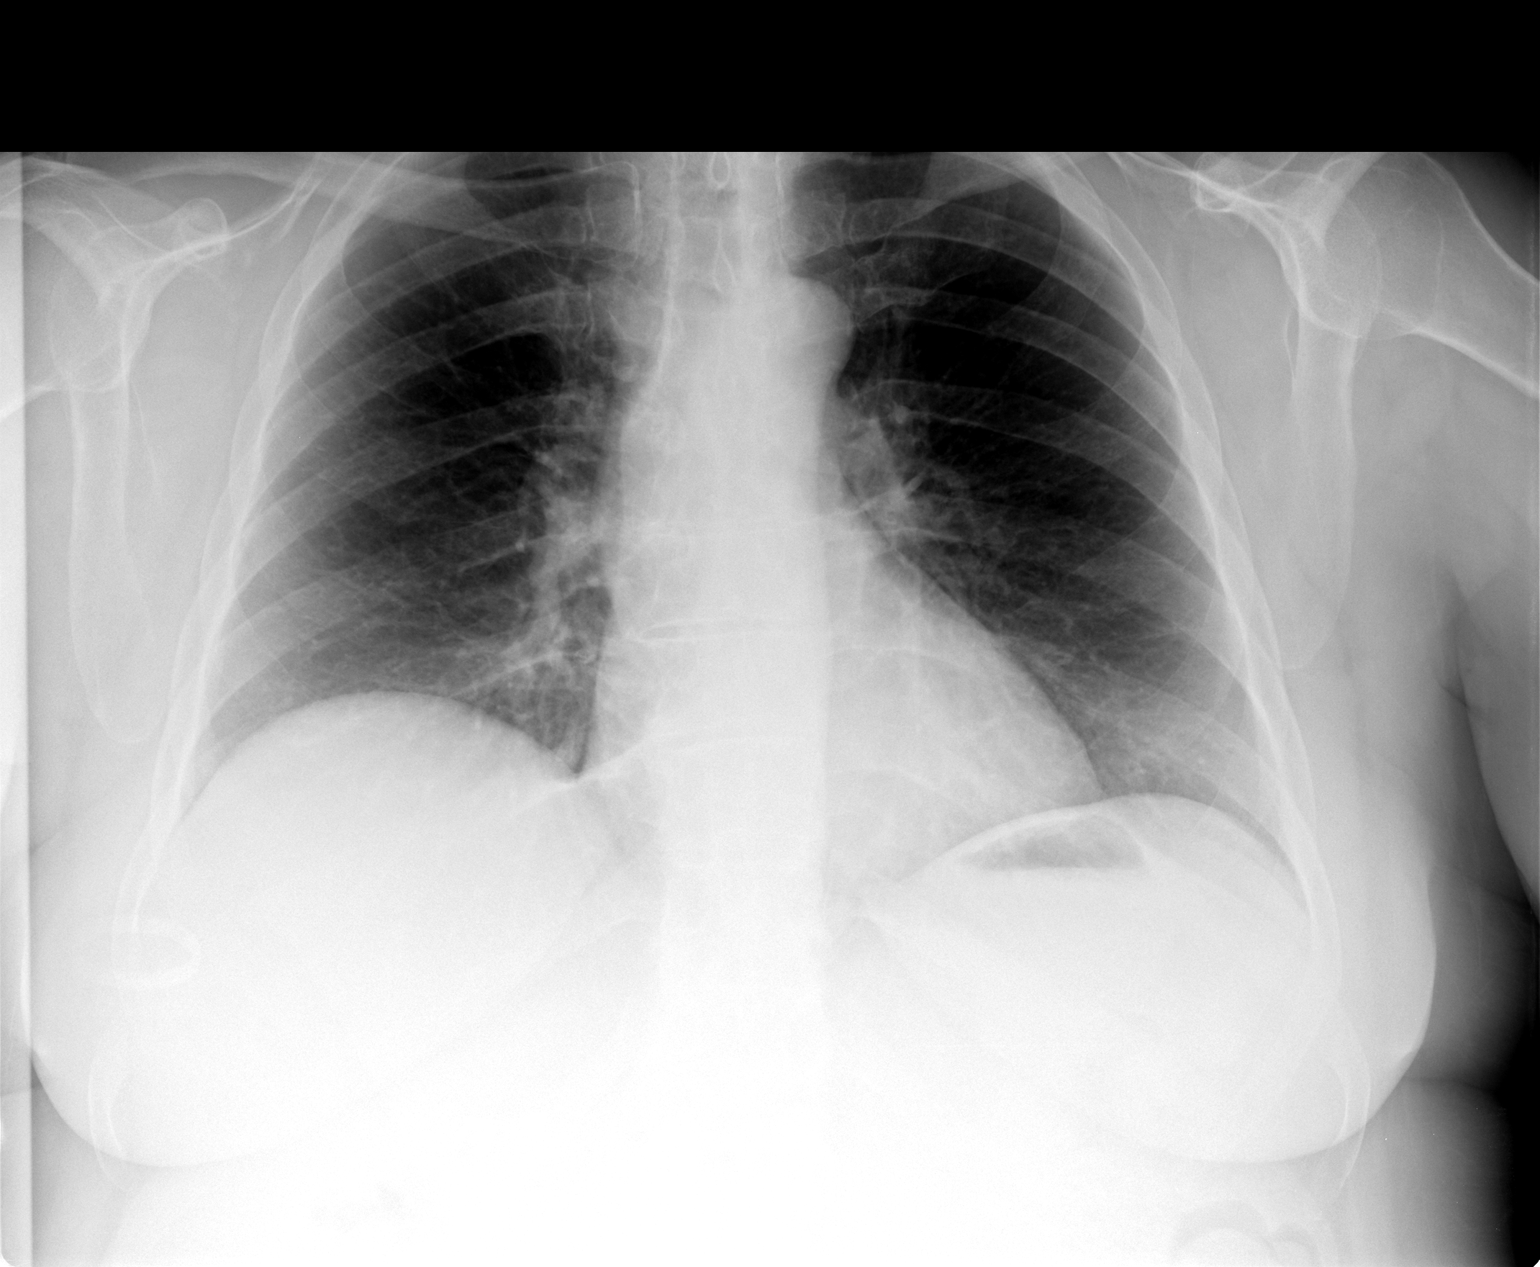

[view not recorded (2 of 2)]
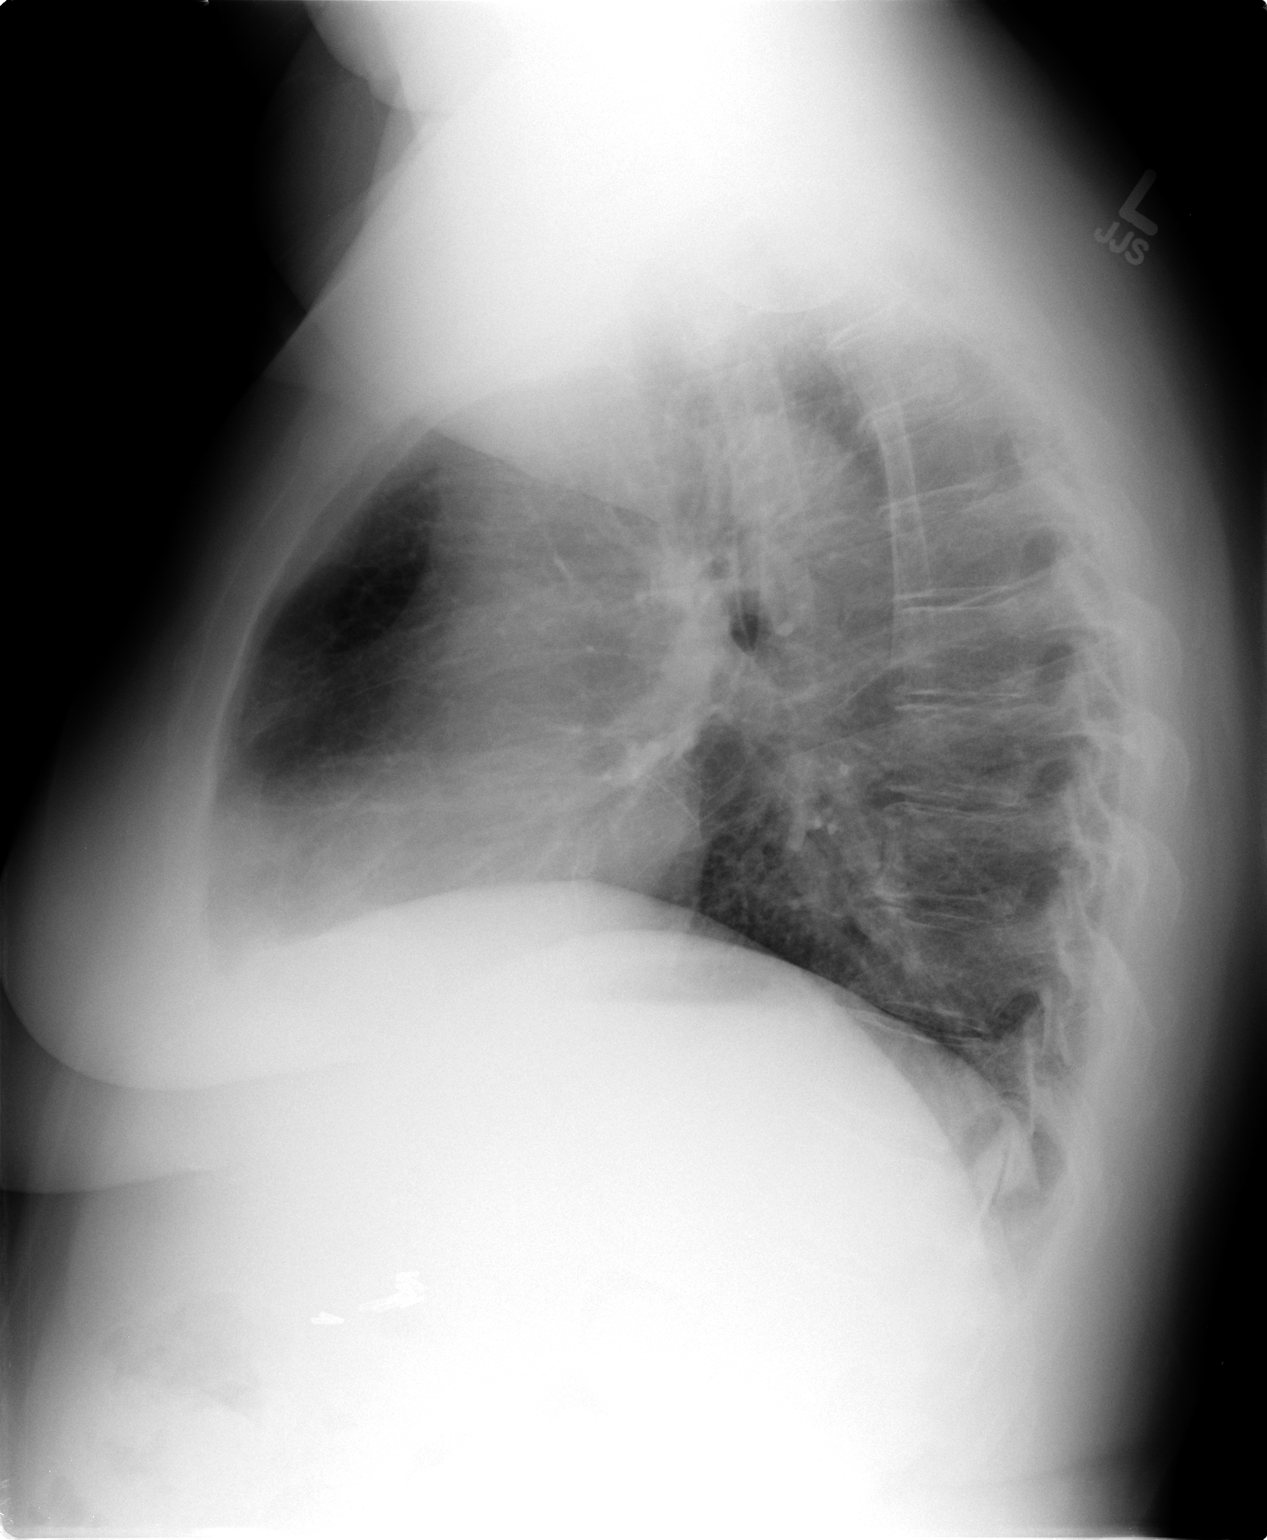

[2 of 2 positions shown; findings below may reference images not displayed]

FINDINGS: The lungs are clear.  Mediastinal contours are stable.
The heart is within upper limits of normal.  A lower anterior
cervical spine fusion plate is present.  No bony abnormality is
seen.  Surgical clips are present in the right upper quadrant from
prior cholecystectomy.
IMPRESSION: No active lung disease.

## 2012-07-23 ENCOUNTER — Other Ambulatory Visit: Payer: Self-pay | Admitting: Pulmonary Disease

## 2012-10-01 ENCOUNTER — Telehealth: Payer: Self-pay | Admitting: Pulmonary Disease

## 2012-10-01 NOTE — Telephone Encounter (Signed)
Spoke with patient, states she went to Urgent Care x 1week ago and was dx with the Flue. Was given Tamiflu and cough syrup in which she has completed the tamiflu course. Patient states she is still having a deep prod cough with white to yellowish green mucus at times but it does appear to be clearing up. Patient states she also still having vertigo, headaches and feels like her ears are stopped up all which are symptoms she had when she originally started feeling bad. Would like to know if this is normal, how long should these symptoms last and if Dr. Kriste Basque has any further recs for her. Pleas advise, thank you  Last OV:05/07/12 Next OV:11/03/12  Allergies  Allergen Reactions  . Codeine     REACTION: itching--nervous  . Pristiq (Desvenlafaxine)     Nausea, headahce, increased BP and palpitations

## 2012-10-01 NOTE — Telephone Encounter (Signed)
Per SN---sounds like bronchitis---  If clearing up, give it another week with rest, fluids, mucinex, tylenol, etc.

## 2012-10-01 NOTE — Telephone Encounter (Signed)
I spoke with pt and is aware of SN recs. She voiced her understanding and RX has been sent. Nothing further was needed 

## 2012-10-08 ENCOUNTER — Telehealth: Payer: Self-pay | Admitting: Pulmonary Disease

## 2012-10-08 MED ORDER — LEVOFLOXACIN 500 MG PO TABS: 500.0000 mg | ORAL_TABLET | Freq: Every day | ORAL | Status: DC

## 2012-10-08 NOTE — Telephone Encounter (Signed)
Per SN---  Call in levaquin 500 mg  #7  1 daily mucinex otc   2 po bid Fluids Delsym if needed  Will need an ov if increase SOB.    Called and spoke with pt and she is aware of SN recs and nothing further is needed.

## 2012-10-08 NOTE — Telephone Encounter (Signed)
Last OV 05-07-12. Pt is c/o having chest tightness, and chest discomfort, increased productive cough with yellow/green phlegm x 3 days. Pt states she cannot come in for OV at this time because on 09-26-12 she was diagnosed with the flu and had to miss several days of work then. Pt states she took tamiflu and felt like she improed after the flu and felt well for a week or so and then three days ago she began to have the above symptoms again. Please advise. Carron Curie, CMA Allergies  Allergen Reactions  . Codeine     REACTION: itching--nervous  . Pristiq (Desvenlafaxine)     Nausea, headahce, increased BP and palpitations

## 2012-11-03 ENCOUNTER — Ambulatory Visit (INDEPENDENT_AMBULATORY_CARE_PROVIDER_SITE_OTHER): Payer: 59 | Admitting: Pulmonary Disease

## 2012-11-03 ENCOUNTER — Encounter: Payer: Self-pay | Admitting: Pulmonary Disease

## 2012-11-03 VITALS — BP 142/82 | HR 90 | Temp 98.4°F | Ht 67.0 in | Wt 270.8 lb

## 2012-11-03 DIAGNOSIS — F329 Major depressive disorder, single episode, unspecified: Secondary | ICD-10-CM

## 2012-11-03 DIAGNOSIS — I872 Venous insufficiency (chronic) (peripheral): Secondary | ICD-10-CM

## 2012-11-03 DIAGNOSIS — IMO0001 Reserved for inherently not codable concepts without codable children: Secondary | ICD-10-CM

## 2012-11-03 DIAGNOSIS — G43909 Migraine, unspecified, not intractable, without status migrainosus: Secondary | ICD-10-CM

## 2012-11-03 DIAGNOSIS — K589 Irritable bowel syndrome without diarrhea: Secondary | ICD-10-CM

## 2012-11-03 DIAGNOSIS — E669 Obesity, unspecified: Secondary | ICD-10-CM

## 2012-11-03 DIAGNOSIS — E785 Hyperlipidemia, unspecified: Secondary | ICD-10-CM

## 2012-11-03 DIAGNOSIS — K219 Gastro-esophageal reflux disease without esophagitis: Secondary | ICD-10-CM

## 2012-11-03 DIAGNOSIS — F419 Anxiety disorder, unspecified: Secondary | ICD-10-CM

## 2012-11-03 DIAGNOSIS — F341 Dysthymic disorder: Secondary | ICD-10-CM

## 2012-11-03 DIAGNOSIS — M5030 Other cervical disc degeneration, unspecified cervical region: Secondary | ICD-10-CM

## 2012-11-03 MED ORDER — ALPRAZOLAM 0.5 MG PO TABS: ORAL_TABLET | ORAL | Status: DC

## 2012-11-03 NOTE — Patient Instructions (Addendum)
Today we updated your med list in our EPIC system...    Continue your current medications the same...  We wrote a new prescription for Xanax (Alprazolam) 0.5mg  to use 1/2 to 1 tab up to 3 times daily as needed...  Let's get on track w/ our diet & exercise program...  Call for any questions...  Let's plan a follow up visit in 69mo, sooner if needed for problems.Marland KitchenMarland Kitchen

## 2012-11-03 NOTE — Progress Notes (Addendum)
Subjective:    Patient ID: Sheila Dawson, female    DOB: 06/12/1956, 57 y.o.   MRN: [id number]  HPI 57 y/o WF here for a follow up visit & CPX... she has multiple medical problems as noted below...  ~  July 05, 2010:  656mo ROV- c/o sinus congestion, drainage, HA, sneezing, etc> using Tylenol sinus, Mucinex, Fluids, Saline, Allegra, Flonase, etc... her GYN is DrRevard w/ abn PAP- precancerous cells yrs ago & Cx frozen then;  had another abn PAP recently & told f/u 64yr but she is uncomfortable & wants me to review the report, offered second opin consult but she decines...  she also c/o continue neck & back discomfort- on Ibuprofen, rest, heat, & want Lyrica trial- OK... refills for 90d requested.  ~  March 13, 2011:  10mo ROV & add-on for month long resp tract illness characterized by sore throat, cough, scant sput production, intermittent pleuritic CP, ?fever, +sweats, & some incr SOB> she had Augmentin called in but symptoms linger & we discussed Asthmatic Bronchitis & the need for Depo & Prednisone course to reduce the bronchitic inflammation...  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   ~  March 30, 2012:  Yearly ROV & [name] reports doing well in general & denies new complaints or concerns;  She has been active, recent vacation, & FM flair;  She is concerned about her appetite (too good, wt noted to be up 8# to 272#);  Notes occas palpit, "flip flop", improves w/ cough, but she declines f/u EKG or Cards referral- we reviewed no caffeine etc;  She is not fasting today for labs and wants to wait to sched a CPX...    We reviewed prob list, meds, xrays and labs> see below >> OK 2013 Flu vaccine today, & requests refills for 90d supplies...  ~  May 07, 2012:  Follow up visit & CPX>     AB> on Mucinex prn; breathing is ok but notes some "congestion" & wants  inhaler- we discussed XopenexHFA...    VI>  notes intermittent edema; we reviewed no salt, elevation, support hose, etc...    Chol> on Diet, CoQ10, Fish Oil; FLP isn't bad w/ TChol 184, TG 165, HDL 44, LDL 107; needs better diet, low fat, get wt down...    Obese> her wt is down 3# to 269# but BMI ~41; we reviewed diet, exercise, wt reduction strategies...    GI> GERD, IBS> note occas reflux but relates it to certain foods; uses Miralax which helps...    Ortho> DDD, FM> on Advil prn & Zanaflex 4mg  tid; "my muscles are bad" "I hurt all the time" c/o neck shoulders, legs & back "it's the FM"; she has seen Ortho & rheum in the past but wants another Rheum consult- wants to see [facility]...    Anxiety/ Depression> on Celexa20Bid; prev intol to Pristiq We reviewed prob list, meds, xrays and labs> see below for updates >> she had the 2013 flu vaccine 9/13. EKG 10/13 showed NSR, rate65, WNL, NAD... LABS 10/13:  FLP- fair on diet/FishOil w/ TG=165 LDL107;  Chems- wnl;  CBC- wnl;  TSH=1.21;  UA- clear...   ~  November 03, 2012:  56mo ROV & Sheila Dawson has had a reasonable interval but notes sl cough (using OTC rx), had the flu & was OOW for 1wk this winter, & notes incr  stress at work & home w/ husb's illness (TIA); we agreed to write for Alpraz0.5mg  for pr use... We reviewed the following medical problems during today's office visit >>      AB> on Xopenex HFA, Mucinex prn, & OTC "sinus relief"; breathing is ok but notes some cough & congestion w/ the pollen...    VI>  notes intermittent edema; we reviewed no salt, elevation, support hose, etc...    Chol> on Diet, CoQ10, Fish Oil; FLP 10/13 isn't bad w/ TChol 184, TG 165, HDL 44, LDL 107; needs better diet, low fat, get wt down...    Obese> her wt is up 2# to 271# but BMI ~41; we reviewed diet, exercise, wt reduction strategies; she is not inclined toward bariatric surg...    GI> GERD, IBS> notes occas reflux but relates it to certain foods; uses Miralax which helps...     Ortho> DDD, FM> on Advil prn & Zanaflex 4mg  tid; "my muscles are bad" "I hurt all the time" c/o neck shoulders, legs & back "it's the FM"; she has seen Ortho & Rheum (Deveshwar) in the past but wanted another rheum consult & saw [facility] 12/13 but was very disappointed- "she agreed w/ your diagnosis & she doesn't treat FM"... Rec to consider Cymbalta, Savella, Lyrica, Gabapentin trials...    Anxiety/ Depression> on Celexa20Bid; prev intol to Pristiq... She is under a lot of stress & rec to use Alpraz0.5mg  Tid as needed.    Health Maint> she also takes ASA81, CoQ10, Fish Oil, MVI, VitD, etc...0  We reviewed prob list, meds, xrays and labs> see below for updates >>            PROBLEM LIST:  ASTHMATIC BRONCHITIS, ACUTE (ICD-466.0) - never smoker, occas episodes of URI's w/ congestion and intermittent wheezing... ~  CXR 7/10 showed prev neck surg, clear lungs, NAD.Marland Kitchen. ~  8/12:  Presents w/ refractory AB episode> given Augmentin as outpt, then OV w/ Depo/ Pred taper... ~  CXR 8/12 showed clear lungs, borderline heart size, neck surg fusion plates, clips RUQ from prev GB surg... ~  4/14:  on Xopenex HFA, Mucinex prn, & OTC "sinus relief"; breathing is ok but notes some cough & congestion w/ the pollen.  VENOUS INSUFFICIENCY (ICD-459.81) - she knows to avoid sodium, elevate legs, wear support hose, etc... she wants to see a vein doctor, she says.  HYPERLIPIDEMIA, MILD (ICD-272.4) - she has a mild mixed hyperlipidemia- diet rx & weight reduction... ~  FLP 7/10 showed TChol 180, TG 164, HDL 40, LDL 107... rec> get wt down. ~  FLP 12/11 showed TChol 197, TG 183, HDL 40, LDL 121... needs low chol/ low fat diet. ~  FLP 10/13 on diet alone showed TChol 184, TG 165, HDL 44, LDL 107  OBESITY (RUE-454.09) - she has been counseled on diet, exercise, etc... ~  weights in the 2000's betw 244-284# ~  weight 12/08 = 279# ~  weight 7/10 = 273# ~  weight 12/11 = 265# ~  weight 8/12 = 265# ~  Weight 10/13 =  269#... She is concerned about her appetite & we discussed Bariatric Surg as a means of controlling her appetite. ~  Weight 4/14 = 271#  GERD (ICD-530.81) - treated w/ OTC PPI or H2Blockers Prn... ~  she had an EGD by DrPatterson in 2000 that was essentially WNL... ~  10/13:  She relates reflux symptoms to certain foods she eats...  IRRITABLE BOWEL SYNDROME (ICD-564.1) - w/ constipation and hx hemorroids... on  MIRALAX daily... ~  colonoscopy 2/02 by DrPatterson was neg- f/u planned 26yrs...  DEGENERATIVE DISC DISEASE, CERVICAL SPINE (ICD-722.4) - hx DDD w/ pain to right shoulder leading to CSpine fusion by DrYates in 2006... ~  s/p C5-C6 & C6-C7 anterior cervical discectomy & fusion w/  allograft & plating- 7/06 by DrYates...  FIBROMYALGIA (ICD-729.1) - on ZANAFLEX 4mg  Tid Prn & CITALOPRAM 20mg - 2tabs daily for pain... she had Rheum eval in the 1990's by DrTruslow, & DrDeveshwar in 2003... she's also had bursitis w/ injections, and a pos ANA... ~  We prev rec trial Lyrica but pt never filled this Rx... 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 ~  10/13: she is requesting a Rheum Consult w/ DrHawkes to see if she can help... ~  4/14:  on Advil prn & Zanaflex 4mg  tid; "my muscles are bad" "I hurt all the time" c/o neck shoulders, legs & back "it's the FM"; she has seen Ortho & Rheum (Deveshwar) in the past but wanted another rheum consult & saw [facility] 12/13 but was very disappointed- "she agreed w/ your diagnosis & she doesn't treat FM"... Rec to consider Cymbalta, Savella, Lyrica, Gabapentin trials.  MIGRAINE HEADACHE (ICD-346.90) - she [facility]Freeman at [facility] in 2005 & tried Topamax + Paxil + Zanaflex... ~  9/13:  Doing satis & notes occas sinus HAs that respond to Tylenol & OTC rx...  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 ~  4/14:  on Celexa20Bid; prev intol to Pristiq... She is under a lot of stress & rec to use ALPRAZOLAM 0.5mg  tid as needed.  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 ~  GYN= DrRevard now w/ hx endometriosis, & abn PAP as noted. ~  GI by DrPatterson and up to date w/ neg colon in 2002 ~  she takes numerous supplements: Calcium, Magnesium, Vit C, Vit D, CoQ10...    Past Surgical History  Procedure Laterality Date  . Cholecystectomy  1988  . Laser surgery for endometriosis  1993    Dr. Jamey Ripa  . Abdominal hysterectomy  1993  . Hemorrhoid surgery  2001    Dr. Jamey Ripa  . C-5, c-6 & c-6, c-7 anterior cervical disectomy and fusion with allograft and plating  01/2005    Dr. Ophelia Charter    Outpatient Encounter Prescriptions as of 11/03/2012  Medication Sig Dispense Refill  . aspirin 81 MG tablet Take 81 mg by mouth 2 (two) times daily.      . beta carotene w/minerals (OCUVITE) tablet Take 1 tablet by mouth daily.      . Calcium Carbonate-Vitamin D (CALCIUM 600+D) 600-400 MG-UNIT per tablet Take 1 tablet by mouth daily.        . cholecalciferol (VITAMIN D) 1000 UNITS tablet Take 1,000 Units by mouth 2 (two) times daily.        . citalopram (CELEXA) 20 MG tablet Take 1 tablet (20 mg total) by mouth 2 (two) times daily.  180 tablet  3  . Coenzyme Q10 100 MG TABS Take 1 tablet by mouth daily.        . fish oil-omega-3 fatty acids 1000  MG capsule Take 2 g by mouth 2 (two) times daily.      Marland Kitchen guaiFENesin (MUCINEX) 600 MG 12 hr tablet Take 600 mg by mouth daily.      Marland Kitchen ibuprofen (ADVIL,MOTRIN) 200 MG tablet Per bottle as needed       . levalbuterol (XOPENEX HFA) 45 MCG/ACT inhaler Inhale 1-2 puffs into the lungs every 6 (six) hours as needed for wheezing.  1 Inhaler  12  . levofloxacin (LEVAQUIN) 500 MG tablet Take 1  tablet (500 mg total) by mouth daily.  7 tablet  0  . Magnesium 250 MG TABS Take 1 tablet by mouth daily.        . polyethylene glycol (MIRALAX / GLYCOLAX) packet Take 17 g by mouth daily.        . Pseudoephedrine-Acetaminophen (SINUS RELIEF PO) Take by mouth as needed.      Marland Kitchen tiZANidine (ZANAFLEX) 4 MG tablet TAKE ONE TABLET BY MOUTH THREE TIMES DAILY AS DIRECTED.  270 tablet  0  . vitamin C (ASCORBIC ACID) 500 MG tablet Take 500 mg by mouth daily.         No facility-administered encounter medications on file as of 11/03/2012.    Allergies  Allergen Reactions  . Codeine     REACTION: itching--nervous  . Pristiq (Desvenlafaxine)     Nausea, headahce, increased BP and palpitations    Current Medications, Allergies, Past Medical History, Past Surgical History, Family History, and Social History were reviewed in [employer] record.   Review of Systems         The patient complains of dyspnea on exertion, back pain, muscle cramps, and arthritis.  The patient denies fever, chills, sweats, anorexia, fatigue, weakness, malaise, weight loss, sleep disorder, blurring, diplopia, eye irritation, eye discharge, vision loss, eye pain, photophobia, earache, ear discharge, tinnitus, decreased hearing, nasal congestion, nosebleeds, sore throat, hoarseness, chest pain, palpitations, syncope, orthopnea, PND, peripheral edema, cough, dyspnea at rest, excessive sputum, hemoptysis, wheezing, pleurisy, nausea, vomiting, diarrhea, constipation, change in bowel habits, abdominal pain, melena, hematochezia, jaundice, gas/bloating, indigestion/heartburn, dysphagia, odynophagia, dysuria, hematuria, urinary frequency, urinary hesitancy, nocturia, incontinence, joint pain, joint swelling, muscle weakness, stiffness, sciatica, restless legs, leg pain at night, leg pain with exertion, rash, itching, dryness, suspicious lesions, paralysis, paresthesias, seizures, tremors, vertigo, transient blindness,  frequent falls, frequent headaches, difficulty walking, depression, anxiety, memory loss, confusion, cold intolerance, heat intolerance, polydipsia, polyphagia, polyuria, unusual weight change, abnormal bruising, bleeding, enlarged lymph nodes, urticaria, allergic rash, hay fever, and recurrent infections.   Objective:   Physical Exam     WD, Overweight, 57 y/o WF in NAD... Pos trigger points... GENERAL:  Alert & oriented; pleasant & cooperative... HEENT:  [place]/AT, EOM-wnl, PERRLA, Fundi-benign, EACs-clear, TMs-wnl, NOSE-clear, THROAT-clear & wnl. NECK:  Supple w/ decrROM & scar of surg; no JVD; normal carotid impulses w/o bruits; no thyromegaly or nodules palpated; no lymphadenopathy. CHEST:  Clear to P & A; without wheezes/ rales/ or rhonchi. HEART:  Regular Rhythm; without murmurs/ rubs/ or gallops. ABDOMEN:  Obese, soft & nontender; normal bowel sounds; no organomegaly or masses detected. EXT: without deformities, mild arthritic changes; no varicose veins/ +venous insuffic/ tr edema. NEURO:  CN's intact; motor testing normal; sensory testing normal; gait normal & balance OK. DERM:  No lesions noted; no rash etc...  RADIOLOGY DATA:  Reviewed in the EPIC EMR & discussed w/ the patient...  LABORATORY DATA:  Reviewed in the EPIC EMR & discussed w/ the patient...   Assessment &  Plan:    Asthmatic Bronchitis>  Her breathing has been OK, but she notes some difficulty w/ the pollen- we discussed OTC meds.  HYPERLIPID>  Needs better low chol/ low fat diet & weight reduction; f/u FLP reviewed & wt reduction is the key...  Obesity>  We discussed calorie restriction, diet & exercise; consider Bariatic surg w/ BMI=41...  GI> GERD, IBS>  Stable on OTC meds as noted...  DJD, DDD, etc>  She has had CSpine surg from [place] in 2006...  Fibromyalgia>  Stable w/ +trigger points; she never tried the Lyrica; she had another rheum consult w/ DrHawkes=> no change in therapy since she doesn't treat this  condition.  Anxiety/ Depression>  She switched from Celexa to Pristiq & back, she likes the Celexa Rx... Under a lot of stress & we wrote for prn ALPRAZOLAM.   Patient's Medications  New Prescriptions   ALPRAZOLAM (XANAX) 0.5 MG TABLET    Take 1/2 to 1 tablet by mouth three times daily as needed for nerves  Previous Medications   ASPIRIN 81 MG TABLET    Take 81 mg by mouth 2 (two) times daily.   BETA CAROTENE W/MINERALS (OCUVITE) TABLET    Take 1 tablet by mouth daily.   CALCIUM CARBONATE-VITAMIN D (CALCIUM 600+D) 600-400 MG-UNIT PER TABLET    Take 1 tablet by mouth daily.     CHOLECALCIFEROL (VITAMIN D) 1000 UNITS TABLET    Take 1,000 Units by mouth 2 (two) times daily.     CITALOPRAM (CELEXA) 20 MG TABLET    Take 1 tablet (20 mg total) by mouth 2 (two) times daily.   COENZYME Q10 100 MG TABS    Take 1 tablet by mouth daily.     FISH OIL-OMEGA-3 FATTY ACIDS 1000 MG CAPSULE    Take 2 g by mouth 2 (two) times daily.   GUAIFENESIN (MUCINEX) 600 MG 12 HR TABLET    Take 600 mg by mouth daily.   IBUPROFEN (ADVIL,MOTRIN) 200 MG TABLET    Per bottle as needed    LEVALBUTEROL (XOPENEX HFA) 45 MCG/ACT INHALER    Inhale 1-2 puffs into the lungs every 6 (six) hours as needed for wheezing.   MAGNESIUM 250 MG TABS    Take 1 tablet by mouth daily.     POLYETHYLENE GLYCOL (MIRALAX / GLYCOLAX) PACKET    Take 17 g by mouth daily.     PSEUDOEPHEDRINE-ACETAMINOPHEN (SINUS RELIEF PO)    Take by mouth as needed.   TIZANIDINE (ZANAFLEX) 4 MG TABLET    TAKE ONE TABLET BY MOUTH THREE TIMES DAILY AS DIRECTED.   VITAMIN C (ASCORBIC ACID) 500 MG TABLET    Take 500 mg by mouth daily.    Modified Medications   No medications on file  Discontinued Medications   LEVOFLOXACIN (LEVAQUIN) 500 MG TABLET    Take 1 tablet (500 mg total) by mouth daily.

## 2012-12-26 ENCOUNTER — Other Ambulatory Visit: Payer: Self-pay | Admitting: Pulmonary Disease

## 2013-02-21 ENCOUNTER — Telehealth: Payer: Self-pay | Admitting: Pulmonary Disease

## 2013-02-21 NOTE — Telephone Encounter (Signed)
Spoke with pt-- Pt c/o "chest pain" x 1 day. "Feels like weight pressing on chest / lower R side".   Pain in sternum and to right side//tenderness Feels she may have pulled a muscle saturday lifting a heavy box. Pt states that chest hurts with deep inhalations.    Please advise Dr Kriste Basque if patient needs OV to come have this checked out. Thanks.

## 2013-02-21 NOTE — Telephone Encounter (Signed)
Per SN---ov to check vs---  Rest, heat to the area, no lifting, call in tramadol 50 mg  #90   1 po tid prn .  Called and lmomtcb for the pt.

## 2013-02-22 ENCOUNTER — Encounter: Payer: Self-pay | Admitting: Pulmonary Disease

## 2013-02-22 ENCOUNTER — Ambulatory Visit (INDEPENDENT_AMBULATORY_CARE_PROVIDER_SITE_OTHER): Payer: 59 | Admitting: Pulmonary Disease

## 2013-02-22 ENCOUNTER — Ambulatory Visit (INDEPENDENT_AMBULATORY_CARE_PROVIDER_SITE_OTHER)
Admission: RE | Admit: 2013-02-22 | Discharge: 2013-02-22 | Disposition: A | Discharge status: Home / Self Care | Payer: 59 | Source: Ambulatory Visit | Attending: Pulmonary Disease | Admitting: Pulmonary Disease

## 2013-02-22 VITALS — BP 112/84 | HR 76 | Temp 98.4°F | Ht 67.0 in | Wt 269.0 lb

## 2013-02-22 DIAGNOSIS — R0600 Dyspnea, unspecified: Secondary | ICD-10-CM

## 2013-02-22 DIAGNOSIS — M5030 Other cervical disc degeneration, unspecified cervical region: Secondary | ICD-10-CM

## 2013-02-22 DIAGNOSIS — F329 Major depressive disorder, single episode, unspecified: Secondary | ICD-10-CM

## 2013-02-22 DIAGNOSIS — R079 Chest pain, unspecified: Secondary | ICD-10-CM

## 2013-02-22 DIAGNOSIS — K589 Irritable bowel syndrome without diarrhea: Secondary | ICD-10-CM

## 2013-02-22 DIAGNOSIS — E669 Obesity, unspecified: Secondary | ICD-10-CM

## 2013-02-22 DIAGNOSIS — IMO0001 Reserved for inherently not codable concepts without codable children: Secondary | ICD-10-CM

## 2013-02-22 DIAGNOSIS — R0609 Other forms of dyspnea: Secondary | ICD-10-CM

## 2013-02-22 DIAGNOSIS — M940 Chondrocostal junction syndrome [Tietze]: Secondary | ICD-10-CM

## 2013-02-22 DIAGNOSIS — K219 Gastro-esophageal reflux disease without esophagitis: Secondary | ICD-10-CM

## 2013-02-22 MED ORDER — TRAMADOL HCL 50 MG PO TABS: 50.0000 mg | ORAL_TABLET | Freq: Three times a day (TID) | ORAL | Status: DC | PRN

## 2013-02-22 NOTE — Progress Notes (Signed)
Subjective:    Patient ID: Sheila Dawson, female    DOB: 05/11/1956, 57 y.o.   MRN: [id number]  HPI 57 y/o WF here for a follow up visit & CPX... she has multiple medical problems as noted below...  ~  March 30, 2012:  Yearly ROV & [name] reports doing well in general & denies new complaints or concerns;  She has been active, recent vacation, & FM flair;  She is concerned about her appetite (too good, wt noted to be up 8# to 272#);  Notes occas palpit, "flip flop", improves w/ cough, but she declines f/u EKG or Cards referral- we reviewed no caffeine etc;  She is not fasting today for labs and wants to wait to sched a CPX...    We reviewed prob list, meds, xrays and labs> see below >> OK 2013 Flu vaccine today, & requests refills for 90d supplies...  ~  May 07, 2012:  Follow up visit & CPX>     AB> on Mucinex prn; breathing is ok but notes some "congestion" & wants inhaler- we discussed XopenexHFA...    VI>  notes intermittent edema; we reviewed no salt, elevation, support hose, etc...    Chol> on Diet, CoQ10, Fish Oil; FLP isn't bad w/ TChol 184, TG 165, HDL 44, LDL 107; needs better diet, low fat, get wt down...    Obese> her wt is down 3# to 269# but BMI ~41; we reviewed diet, exercise, wt reduction strategies...    GI> GERD, IBS> note occas reflux but relates it to certain foods; uses Miralax which helps...    Ortho> DDD, FM> on Advil prn & Zanaflex 4mg  tid; "my muscles are bad" "I hurt all the time" c/o neck shoulders, legs & back "it's the FM"; she has seen Ortho & rheum in the past but wants another Rheum consult- wants to [facility]...    Anxiety/ Depression> on Celexa20Bid; prev intol to Pristiq We reviewed prob list, meds, xrays and labs> see below for updates >> she had the 2013 flu vaccine 9/13. EKG 10/13 showed NSR, rate65, WNL, NAD... LABS 10/13:  FLP- fair on diet/FishOil w/ TG=165 LDL107;  Chems- wnl;  CBC- wnl;  TSH=1.21;  UA- clear...   ~  November 03, 2012:  76mo ROV &  Reece Levy has had a reasonable interval but notes sl cough (using OTC rx), had the flu & was OOW for 1wk this winter, & notes incr stress at work & home w/ husb's illness (TIA); we agreed to write for Alpraz0.5mg  for pr use... We reviewed the following medical problems during today's office visit >>     AB> on Xopenex HFA, Mucinex prn, & OTC "sinus relief"; breathing is ok but notes some cough & congestion w/ the pollen...    VI>  notes intermittent edema; we reviewed no salt, elevation, support hose, etc...    Chol> on Diet, CoQ10, Fish Oil; FLP 10/13 isn't bad w/ TChol 184, TG 165, HDL 44, LDL 107; needs better diet, low fat, get wt down...    Obese> her wt is up 2# to 271# but BMI ~41; we reviewed diet, exercise, wt reduction strategies; she is not inclined toward bariatric surg...    GI> GERD, IBS> notes occas reflux but relates it to certain foods; uses Miralax which helps...    Ortho> DDD, FM> on Advil prn & Zanaflex 4mg  tid; "my muscles are bad" "I hurt all the time" c/o neck shoulders, legs & back "it's the FM"; she has seen  Ortho & Rheum (Deveshwar) in the past but wanted another rheum consult & saw [facility] 12/13 but was very disappointed- "she agreed w/ your diagnosis & she doesn't treat FM"... Rec to consider Cymbalta, Savella, Lyrica, Gabapentin trials...    Anxiety/ Depression> on Celexa20Bid; prev intol to Pristiq... She is under a lot of stress & rec to use Alpraz0.5mg  Tid as needed.    Health Maint> she also takes ASA81, CoQ10, Fish Oil, MVI, VitD, etc...0 We reviewed prob list, meds, xrays and labs> see below for updates >>   ~  February 22, 2013:  48mo ROV & add-on appt for CP> she lifted a box several days ago w/ onset soreness, tender, then breathing harder than usual, hard to get the air "IN";  As noted she has FM, DDD, migraines, and anxiety/depression;  Exam show mult trigger points & costochondritis of the chest wall;  O2 sat = 98% on RA, VSS, chest wall is tender, +triggers, breath sounds  are clear;  We discussed Rx w/ rest, apply heat, & ALPRAZOLAM 0.5 1/2-1 tid...    She has seen DrHawkes (12/13) for Rheum> FM w/ prev eval by DrDeveshwar as well, Citalopram & Tizanidine seem to help, rec to consider Cymbalta or Savella or Lyrica if needed, continue exercise... We reviewed prob list, meds, xrays and labs> see below for updates >>  CXR 8/14 showed normal heart size, clear lungs, wnl...           PROBLEM LIST:  ASTHMATIC BRONCHITIS, ACUTE (ICD-466.0) - never smoker, occas episodes of URI's w/ congestion and intermittent wheezing... ~  CXR 7/10 showed prev neck surg, clear lungs, NAD.Marland Kitchen. ~  8/12:  Presents w/ refractory AB episode> given Augmentin as outpt, then OV w/ Depo/ Pred taper... ~  CXR 8/12 showed clear lungs, borderline heart size, neck surg fusion plates, clips RUQ from prev GB surg... ~  4/14:  on Xopenex HFA, Mucinex prn, & OTC "sinus relief"; breathing is ok but notes some cough & congestion w/ the pollen.  VENOUS INSUFFICIENCY (ICD-459.81) - she knows to avoid sodium, elevate legs, wear support hose, etc... she wants to see a vein doctor, she says.  HYPERLIPIDEMIA, MILD (ICD-272.4) - she has a mild mixed hyperlipidemia- diet rx & weight reduction... ~  FLP 7/10 showed TChol 180, TG 164, HDL 40, LDL 107... rec> get wt down. ~  FLP 12/11 showed TChol 197, TG 183, HDL 40, LDL 121... needs low chol/ low fat diet. ~  FLP 10/13 on diet alone showed TChol 184, TG 165, HDL 44, LDL 107  OBESITY (ZOX-096.04) - she has been counseled on diet, exercise, etc... ~  weights in the 2000's betw 244-284# ~  weight 12/08 = 279# ~  weight 7/10 = 273# ~  weight 12/11 = 265# ~  weight 8/12 = 265# ~  Weight 10/13 = 269#... She is concerned about her appetite & we discussed Bariatric Surg as a means of controlling her appetite. ~  Weight 4/14 = 271# ~  Weight 8/14 = 269#  GERD (ICD-530.81) - treated w/ OTC PPI or H2Blockers Prn... ~  she had an EGD by DrPatterson in 2000 that was  essentially WNL... ~  10/13:  She relates reflux symptoms to certain foods she eats...  IRRITABLE BOWEL SYNDROME (ICD-564.1) - w/ constipation and hx hemorroids... on MIRALAX daily... ~  colonoscopy 2/02 by DrPatterson was neg- f/u planned 73yrs...  DEGENERATIVE DISC DISEASE, CERVICAL SPINE (ICD-722.4) - hx DDD w/ pain to right shoulder leading to CSpine  fusion by DrYates in 2006... ~  s/p C5-C6 & C6-C7 anterior cervical discectomy & fusion w/  allograft & plating- 7/06 by DrYates...  FIBROMYALGIA (ICD-729.1) - on ZANAFLEX 4mg  Tid Prn & CITALOPRAM 20mg - 2tabs daily for pain... she had Rheum eval in the 1990's by DrTruslow, & DrDeveshwar in 2003... she's also had bursitis w/ injections, and a pos ANA... ~  We prev rec trial Lyrica but pt never filled this Rx... 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 ~  10/13: she is requesting a Rheum Consult w/ DrHawkes to see if she can help... ~  4/14:  on Advil prn & Zanaflex 4mg  tid; "my muscles are bad" "I hurt all the time" c/o neck shoulders, legs & back "it's the FM"; she has seen Ortho & Rheum (Deveshwar) in the past but wanted another rheum consult & saw [facility] 12/13 but was very disappointed- "she agreed w/ your diagnosis & she doesn't treat FM"... Rec to consider Cymbalta, Savella, Lyrica, Gabapentin trials. ~  8/14: presented w/ CWP after lifting a box> tender, +triggers, c/w FM; rec to use rest, heat, Tramadol & Alpraz prn...  MIGRAINE HEADACHE (ICD-346.90) - she saw [facility]Freeman at [facility] in 2005 & tried Topamax + Paxil + Zanaflex... ~  9/13:  Doing satis & notes occas sinus HAs that respond to Tylenol & OTC rx...  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 ~   4/14:  on Celexa20Bid; prev intol to Pristiq... She is under a lot of stress & rec to use ALPRAZOLAM 0.5mg  tid as needed.  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 ~  GYN= DrRevard now w/ hx endometriosis, & abn PAP as noted. ~  GI by DrPatterson and up to date w/ neg colon in 2002 ~  she takes numerous supplements: Calcium, Magnesium, Vit C, Vit D, CoQ10...    Past Surgical History  Procedure Laterality Date  . Cholecystectomy  1988  . Laser surgery for endometriosis  1993    Dr. Jamey Ripa  . Abdominal hysterectomy  1993  . Hemorrhoid surgery  2001    Dr. Jamey Ripa  . C-5, c-6 & c-6, c-7 anterior cervical disectomy and fusion with allograft and plating  01/2005    Dr. Ophelia Charter    Outpatient Encounter Prescriptions as of 02/22/2013  Medication Sig Dispense Refill  . ALPRAZolam (XANAX) 0.5 MG tablet Take 1/2 to 1 tablet by mouth three times daily as needed for nerves  50 tablet  5  . aspirin 81 MG tablet Take 81 mg by mouth 2 (two) times daily.      . beta carotene w/minerals (OCUVITE) tablet Take 1 tablet by mouth daily.      . Biotin 1000 MCG tablet Take 1,000 mcg by mouth daily.      . Calcium Carbonate-Vitamin D (CALCIUM 600+D) 600-400 MG-UNIT per tablet Take 1 tablet by mouth daily.        . cholecalciferol (VITAMIN D) 1000 UNITS tablet Take 1,000 Units by mouth 2 (two) times daily.        . citalopram (CELEXA) 20 MG tablet Take 1 tablet (20 mg total) by mouth 2 (  two) times daily.  180 tablet  3  . Coenzyme Q10 100 MG TABS Take 1 tablet by mouth daily.        . fish oil-omega-3 fatty acids 1000 MG capsule Take 2 g by mouth 2 (two) times daily.      Marland Kitchen guaiFENesin (MUCINEX) 600 MG 12 hr tablet Take 600 mg by mouth daily.      Marland Kitchen ibuprofen (ADVIL,MOTRIN) 200 MG tablet Per bottle as needed       . levalbuterol (XOPENEX HFA) 45 MCG/ACT inhaler Inhale 1-2 puffs into the lungs every 6 (six) hours as needed for wheezing.  1 Inhaler  12   . Magnesium 250 MG TABS Take 1 tablet by mouth daily.        . polyethylene glycol (MIRALAX / GLYCOLAX) packet Take 17 g by mouth daily.        . Pseudoephedrine-Acetaminophen (SINUS RELIEF PO) Take by mouth as needed.      Marland Kitchen tiZANidine (ZANAFLEX) 4 MG tablet TAKE ONE TABLET BY MOUTH THREE TIMES DAILY AS DIRECTED.  270 tablet  0  . tiZANidine (ZANAFLEX) 4 MG tablet TAKE ONE TABLET BY MOUTH THREE TIMES DAILY AS DIRECTED.  270 tablet  0  . traMADol (ULTRAM) 50 MG tablet Take 1 tablet (50 mg total) by mouth 3 (three) times daily as needed for pain.  90 tablet  0  . vitamin C (ASCORBIC ACID) 500 MG tablet Take 500 mg by mouth daily.         No facility-administered encounter medications on file as of 02/22/2013.    Allergies  Allergen Reactions  . Codeine     REACTION: itching--nervous  . Pristiq (Desvenlafaxine)     Nausea, headahce, increased BP and palpitations    Current Medications, Allergies, Past Medical History, Past Surgical History, Family History, and Social History were reviewed in [employer] record.   Review of Systems         The patient complains of dyspnea on exertion, back pain, muscle cramps, and arthritis.  The patient denies fever, chills, sweats, anorexia, fatigue, weakness, malaise, weight loss, sleep disorder, blurring, diplopia, eye irritation, eye discharge, vision loss, eye pain, photophobia, earache, ear discharge, tinnitus, decreased hearing, nasal congestion, nosebleeds, sore throat, hoarseness, chest pain, palpitations, syncope, orthopnea, PND, peripheral edema, cough, dyspnea at rest, excessive sputum, hemoptysis, wheezing, pleurisy, nausea, vomiting, diarrhea, constipation, change in bowel habits, abdominal pain, melena, hematochezia, jaundice, gas/bloating, indigestion/heartburn, dysphagia, odynophagia, dysuria, hematuria, urinary frequency, urinary hesitancy, nocturia, incontinence, joint pain, joint swelling, muscle weakness, stiffness,  sciatica, restless legs, leg pain at night, leg pain with exertion, rash, itching, dryness, suspicious lesions, paralysis, paresthesias, seizures, tremors, vertigo, transient blindness, frequent falls, frequent headaches, difficulty walking, depression, anxiety, memory loss, confusion, cold intolerance, heat intolerance, polydipsia, polyphagia, polyuria, unusual weight change, abnormal bruising, bleeding, enlarged lymph nodes, urticaria, allergic rash, hay fever, and recurrent infections.   Objective:   Physical Exam     WD, Overweight, 57 y/o WF in NAD... Pos trigger points... GENERAL:  Alert & oriented; pleasant & cooperative... HEENT:  [place]/AT, EOM-wnl, PERRLA, Fundi-benign, EACs-clear, TMs-wnl, NOSE-clear, THROAT-clear & wnl. NECK:  Supple w/ decrROM & scar of surg; no JVD; normal carotid impulses w/o bruits; no thyromegaly or nodules palpated; no lymphadenopathy. CHEST:  Clear to P & A; without wheezes/ rales/ or rhonchi. HEART:  Regular Rhythm; without murmurs/ rubs/ or gallops. ABDOMEN:  Obese, soft & nontender; normal bowel sounds; no organomegaly or masses detected. EXT: without deformities, mild arthritic  changes; no varicose veins/ +venous insuffic/ tr edema. NEURO:  CN's intact; motor testing normal; sensory testing normal; gait normal & balance OK. DERM:  No lesions noted; no rash etc...  RADIOLOGY DATA:  Reviewed in the EPIC EMR & discussed w/ the patient...  LABORATORY DATA:  Reviewed in the EPIC EMR & discussed w/ the patient...   Assessment & Plan:    CHEST WALL PAIN>> from lifting box, assoc FM, anxiety 7 can't get a DB; rec to use rest, heat, Tramadol, Alprazolam Tid...   Asthmatic Bronchitis>  Her breathing has been OK, but she notes some difficulty w/ the pollen- we discussed OTC meds.  HYPERLIPID>  Needs better low chol/ low fat diet & weight reduction; f/u FLP reviewed & wt reduction is the key...  Obesity>  We discussed calorie restriction, diet & exercise;  consider Bariatic surg w/ BMI=41...  GI> GERD, IBS>  Stable on OTC meds as noted...  DJD, DDD, etc>  She has had CSpine surg from [place] in 2006...  Fibromyalgia>  Stable w/ +trigger points; she never tried the Lyrica; she had another rheum consult w/ DrHawkes=> no change in therapy since she doesn't treat this condition.  Anxiety/ Depression>  She switched from Celexa to Pristiq & back, she likes the Celexa Rx... Under a lot of stress & we wrote for prn ALPRAZOLAM.   Patient's Medications  New Prescriptions   No medications on file  Previous Medications   ALPRAZOLAM (XANAX) 0.5 MG TABLET    Take 1/2 to 1 tablet by mouth three times daily as needed for nerves   ASPIRIN 81 MG TABLET    Take 81 mg by mouth 2 (two) times daily.   BETA CAROTENE W/MINERALS (OCUVITE) TABLET    Take 1 tablet by mouth daily.   BIOTIN 1000 MCG TABLET    Take 1,000 mcg by mouth daily.   CALCIUM CARBONATE-VITAMIN D (CALCIUM 600+D) 600-400 MG-UNIT PER TABLET    Take 1 tablet by mouth daily.     CHOLECALCIFEROL (VITAMIN D) 1000 UNITS TABLET    Take 1,000 Units by mouth 2 (two) times daily.     CITALOPRAM (CELEXA) 20 MG TABLET    Take 1 tablet (20 mg total) by mouth 2 (two) times daily.   COENZYME Q10 100 MG TABS    Take 1 tablet by mouth daily.     FISH OIL-OMEGA-3 FATTY ACIDS 1000 MG CAPSULE    Take 2 g by mouth 2 (two) times daily.   GUAIFENESIN (MUCINEX) 600 MG 12 HR TABLET    Take 600 mg by mouth daily.   IBUPROFEN (ADVIL,MOTRIN) 200 MG TABLET    Per bottle as needed    LEVALBUTEROL (XOPENEX HFA) 45 MCG/ACT INHALER    Inhale 1-2 puffs into the lungs every 6 (six) hours as needed for wheezing.   MAGNESIUM 250 MG TABS    Take 1 tablet by mouth daily.     POLYETHYLENE GLYCOL (MIRALAX / GLYCOLAX) PACKET    Take 17 g by mouth daily.     PSEUDOEPHEDRINE-ACETAMINOPHEN (SINUS RELIEF PO)    Take by mouth as needed.   TIZANIDINE (ZANAFLEX) 4 MG TABLET    TAKE ONE TABLET BY MOUTH THREE TIMES DAILY AS DIRECTED.    TIZANIDINE (ZANAFLEX) 4 MG TABLET    TAKE ONE TABLET BY MOUTH THREE TIMES DAILY AS DIRECTED.   TRAMADOL (ULTRAM) 50 MG TABLET    Take 1 tablet (50 mg total) by mouth 3 (three) times daily as needed for pain.  VITAMIN C (ASCORBIC ACID) 500 MG TABLET    Take 500 mg by mouth daily.    Modified Medications   No medications on file  Discontinued Medications   No medications on file

## 2013-02-22 NOTE — Telephone Encounter (Signed)
I spoke with the pt. Appt set today at 2:30pm. Rx sent. Carron Curie, CMA

## 2013-02-22 NOTE — Patient Instructions (Addendum)
Today we updated your med list in our EPIC system...    Continue your current medications the same...  We discussed using a hot towel/ heating pad for the chest wall pain...    Continue the Ibuprofen, Tramadol. Tylenol as well...    Use the Zanaflex as you are doing...    Try the ALPRAZOLAM 0.5mg - 1/2 to 1 tab up to 3 times daily to help relax the chest wall muscles & allow for a deeper breath (easier to get the air "in")...  Today we did a follow up CXR just to be s Their are no acute changes...    We will contact you w/ the results when available...   Call for any questions...  You have a prev sched f/u appt in Oct- why don't we push that back to December.Marland KitchenMarland Kitchen

## 2013-02-22 NOTE — Telephone Encounter (Signed)
Returning call can be reached at [phone].Sheila Dawson

## 2013-04-07 ENCOUNTER — Other Ambulatory Visit: Payer: Self-pay | Admitting: Pulmonary Disease

## 2013-04-07 MED ORDER — TIZANIDINE HCL 4 MG PO TABS: ORAL_TABLET | ORAL | Status: DC

## 2013-04-25 ENCOUNTER — Other Ambulatory Visit: Payer: Self-pay | Admitting: Pulmonary Disease

## 2013-04-26 ENCOUNTER — Telehealth: Payer: Self-pay | Admitting: Pulmonary Disease

## 2013-04-26 MED ORDER — CITALOPRAM HYDROBROMIDE 20 MG PO TABS: 20.0000 mg | ORAL_TABLET | Freq: Two times a day (BID) | ORAL | Status: DC

## 2013-04-26 NOTE — Telephone Encounter (Signed)
I spoke with pt and is aware RX was sent. Nothing further needed

## 2013-05-10 ENCOUNTER — Ambulatory Visit: Payer: PRIVATE HEALTH INSURANCE | Admitting: Pulmonary Disease

## 2013-06-28 ENCOUNTER — Ambulatory Visit (INDEPENDENT_AMBULATORY_CARE_PROVIDER_SITE_OTHER): Payer: 59 | Admitting: Pulmonary Disease

## 2013-06-28 ENCOUNTER — Encounter: Payer: Self-pay | Admitting: Pulmonary Disease

## 2013-06-28 VITALS — BP 138/84 | HR 78 | Temp 98.8°F | Ht 67.0 in | Wt 273.2 lb

## 2013-06-28 DIAGNOSIS — F329 Major depressive disorder, single episode, unspecified: Secondary | ICD-10-CM

## 2013-06-28 DIAGNOSIS — E785 Hyperlipidemia, unspecified: Secondary | ICD-10-CM

## 2013-06-28 DIAGNOSIS — M5030 Other cervical disc degeneration, unspecified cervical region: Secondary | ICD-10-CM

## 2013-06-28 DIAGNOSIS — K589 Irritable bowel syndrome without diarrhea: Secondary | ICD-10-CM

## 2013-06-28 DIAGNOSIS — E669 Obesity, unspecified: Secondary | ICD-10-CM

## 2013-06-28 DIAGNOSIS — IMO0001 Reserved for inherently not codable concepts without codable children: Secondary | ICD-10-CM

## 2013-06-28 DIAGNOSIS — K219 Gastro-esophageal reflux disease without esophagitis: Secondary | ICD-10-CM

## 2013-06-28 DIAGNOSIS — F341 Dysthymic disorder: Secondary | ICD-10-CM

## 2013-06-28 MED ORDER — FLUOCINONIDE-E 0.05 % EX CREA: TOPICAL_CREAM | CUTANEOUS | Status: DC

## 2013-06-28 MED ORDER — PREGABALIN 75 MG PO CAPS: 75.0000 mg | ORAL_CAPSULE | Freq: Two times a day (BID) | ORAL | Status: DC

## 2013-06-28 NOTE — Progress Notes (Signed)
Subjective:    Patient ID: Sheila Dawson, female    DOB: [birth year], 57 y.o.   MRN: [id number]  HPI 57 y/o WF here for a follow up visit & CPX... she has multiple medical problems as noted below...  ~  March 30, 2012:  Yearly ROV & [name] reports doing well in general & denies new complaints or concerns;  She has been active, recent vacation, & FM flair;  She is concerned about her appetite (too good, wt noted to be up 8# to 272#);  Notes occas palpit, "flip flop", improves w/ cough, but she declines f/u EKG or Cards referral- we reviewed no caffeine etc;  She is not fasting today for labs and wants to wait to sched a CPX...    We reviewed prob list, meds, xrays and labs> see below >> OK 2013 Flu vaccine today, & requests refills for 90d supplies...  ~  May 07, 2012:  Follow up visit & CPX>     AB> on Mucinex prn; breathing is ok but notes some "congestion" & wants inhaler- we discussed XopenexHFA...    VI>  notes intermittent edema; we reviewed no salt, elevation, support hose, etc...    Chol> on Diet, CoQ10, Fish Oil; FLP isn't bad w/ TChol 184, TG 165, HDL 44, LDL 107; needs better diet, low fat, get wt down...    Obese> her wt is down 3# to 269# but BMI ~41; we reviewed diet, exercise, wt reduction strategies...    GI> GERD, IBS> note occas reflux but relates it to certain foods; uses Miralax which helps...    Ortho> DDD, FM> on Advil prn & Zanaflex 4mg  tid; "my muscles are bad" "I hurt all the time" c/o neck shoulders, legs & back "it's the FM"; she has seen Ortho & rheum in the past but wants another Rheum consult- wants to see [facility]...    Anxiety/ Depression> on Celexa20Bid; prev intol to Pristiq We reviewed prob list, meds, xrays and labs> see below for updates >> she had the 2013 flu vaccine 9/13. EKG 10/13 showed NSR, rate65, WNL, NAD... LABS 10/13:  FLP- fair on diet/FishOil w/ TG=165 LDL107;  Chems- wnl;  CBC- wnl;  TSH=1.21;  UA- clear...   ~  November 03, 2012:  58mo ROV &  Reece Levy has had a reasonable interval but notes sl cough (using OTC rx), had the flu & was OOW for 1wk this winter, & notes incr stress at work & home w/ husb's illness (TIA); we agreed to write for Alpraz0.5mg  for pr use... We reviewed the following medical problems during today's office visit >>     AB> on Xopenex HFA, Mucinex prn, & OTC "sinus relief"; breathing is ok but notes some cough & congestion w/ the pollen...    VI>  notes intermittent edema; we reviewed no salt, elevation, support hose, etc...    Chol> on Diet, CoQ10, Fish Oil; FLP 10/13 isn't bad w/ TChol 184, TG 165, HDL 44, LDL 107; needs better diet, low fat, get wt down...    Obese> her wt is up 2# to 271# but BMI ~41; we reviewed diet, exercise, wt reduction strategies; she is not inclined toward bariatric surg...    GI> GERD, IBS> notes occas reflux but relates it to certain foods; uses Miralax which helps...    Ortho> DDD, FM> on Advil prn & Zanaflex 4mg  tid; "my muscles are bad" "I hurt all the time" c/o neck shoulders, legs & back "it's the FM"; she has seen  Ortho & Rheum (Deveshwar) in the past but wanted another rheum consult & saw [facility] 12/13 but was very disappointed- "she agreed w/ your diagnosis & she doesn't treat FM"... Rec to consider Cymbalta, Savella, Lyrica, Gabapentin trials...    Anxiety/ Depression> on Celexa20Bid; prev intol to Pristiq... She is under a lot of stress & rec to use Alpraz0.5mg  Tid as needed.    Health Maint> she also takes ASA81, CoQ10, Fish Oil, MVI, VitD, etc...0 We reviewed prob list, meds, xrays and labs> see below for updates >>   ~  February 22, 2013:  26mo ROV & add-on appt for CP> she lifted a box several days ago w/ onset soreness, tender, then breathing harder than usual, hard to get the air "IN";  As noted she has FM, DDD, migraines, and anxiety/depression;  Exam show mult trigger points & costochondritis of the chest wall;  O2 sat = 98% on RA, VSS, chest wall is tender, +triggers, breath sounds  are clear;  We discussed Rx w/ rest, apply heat, & ALPRAZOLAM 0.5 1/2-1 tid...    She has seen DrHawkes (12/13) for Rheum> FM w/ prev eval by DrDeveshwar as well, Citalopram & Tizanidine seem to help, rec to consider Cymbalta or Savella or Lyrica if needed, continue exercise... We reviewed prob list, meds, xrays and labs> see below for updates >>  CXR 8/14 showed normal heart size, clear lungs, wnl...  ~  June 28, 2013:  26mo ROV & [name] reports that the prev costochondritis resolved w/ Rx & time;  Now she is c/o increased "nerve pain" in right arm after her sm dog walked on it recently- she wants to try Lyrica (never took the prev Rx) but is worried about depression side effect as she has had depression in the past (currently doing satis on Celexa20Bid); offered alternative Rx as prev mentioned- Cymbalta, Savella, etc but she wants the Lyrica 7 asking for samples; given Lyrica50 x2wks, then 75x2wks, and Rx for Lyrica75Bid if tolerated...     She is also c/o hand eczema & not responding to an old tube of Triamcinolone cream she had at home, wants something stronger & Rx written for gen Lidex-E cream w/ rec for occlusive dressing (gloves) to aide penetration into skin; keep hands out of water (use playtex gloves for dishes etc...     She is also c/o allergies, sinus drainage- she uses OTC sinus relief, Mucinex, Saline... She does NOT want to take the 2014 flu vaccine due to a "reaction" last yr...     Finally she brought a health form to the office today for [place] to complete w/ various metrics and send in to her employer- [employer]...  We reviewed prob list, meds, xrays and labs> see below for updates >>            PROBLEM LIST:  ASTHMATIC BRONCHITIS, ACUTE (ICD-466.0) - never smoker, occas episodes of URI's w/ congestion and intermittent wheezing... ~  CXR 7/10 showed prev neck surg, clear lungs, NAD.Marland Kitchen. ~  8/12:  Presents w/ refractory AB episode> given Augmentin as outpt, then OV w/ Depo/ Pred  taper... ~  CXR 8/12 showed clear lungs, borderline heart size, neck surg fusion plates, clips RUQ from prev GB surg... ~  4/14:  on Xopenex HFA, Mucinex prn, & OTC "sinus relief"; breathing is ok but notes some cough & congestion w/ the pollen.  VENOUS INSUFFICIENCY (ICD-459.81) - she knows to avoid sodium, elevate legs, wear support hose, etc... she wants to see a  vein doctor, she says.  HYPERLIPIDEMIA, MILD (ICD-272.4) - she has a mild mixed hyperlipidemia- diet rx & weight reduction... ~  FLP 7/10 showed TChol 180, TG 164, HDL 40, LDL 107... rec> get wt down. ~  FLP 12/11 showed TChol 197, TG 183, HDL 40, LDL 121... needs low chol/ low fat diet. ~  FLP 10/13 on diet alone showed TChol 184, TG 165, HDL 44, LDL 107  OBESITY (RUE-454.09) - she has been counseled on diet, exercise, etc... ~  weights in the 2000's betw 244-284# ~  weight 12/08 = 279# ~  weight 7/10 = 273# ~  weight 12/11 = 265# ~  weight 8/12 = 265# ~  Weight 10/13 = 269#... She is concerned about her appetite & we discussed Bariatric Surg as a means of controlling her appetite. ~  Weight 4/14 = 271# ~  Weight 8/14 = 269# ~  Weight 12/14 = 273#  GERD (ICD-530.81) - treated w/ OTC PPI or H2Blockers Prn... ~  she had an EGD by DrPatterson in 2000 that was essentially WNL... ~  10/13:  She relates reflux symptoms to certain foods she eats...  IRRITABLE BOWEL SYNDROME (ICD-564.1) - w/ constipation and hx hemorroids... on MIRALAX daily... ~  colonoscopy 2/02 by DrPatterson was neg- f/u planned 51yrs...  DEGENERATIVE DISC DISEASE, CERVICAL SPINE (ICD-722.4) - hx DDD w/ pain to right shoulder leading to CSpine fusion by DrYates in 2006... ~  s/p C5-C6 & C6-C7 anterior cervical discectomy & fusion w/  allograft & plating- 7/06 by DrYates...  FIBROMYALGIA (ICD-729.1) - on ZANAFLEX 4mg  Tid Prn... she had Rheum eval in the 1990's by DrTruslow, & DrDeveshwar in 2003... she's also had bursitis w/ injections, and a pos ANA... ~  We  prev rec trial Lyrica but pt never filled this Rx... 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 ~  10/13: she is requesting a Rheum Consult w/ DrHawkes to see if she can help... ~  4/14:  on Advil prn & Zanaflex 4mg  tid; "my muscles are bad" "I hurt all the time" c/o neck shoulders, legs & back "it's the FM"; she has seen Ortho & Rheum (Deveshwar) in the past but wanted another rheum consult & saw [facility] 12/13 but was very disappointed- "she agreed w/ your diagnosis & she doesn't treat FM"... Rec to consider Cymbalta, Savella, Lyrica, Gabapentin trials. ~  8/14: presented w/ CWP after lifting a box> tender, +triggers, c/w FM; rec to use rest, heat, Tramadol & Alpraz prn... ~  12/14: presented w/ right arm pain after her little dog walked on it> she wants to try the Lyrica (samples of 50mg /d x2wks=> 75mg /d x2wks, then Rx for 75mg Bid if tol)...  MIGRAINE HEADACHE (ICD-346.90) - she [facility] & tried Topamax + Paxil + Zanaflex... ~  9/13:  Doing satis & notes occas sinus HAs that respond to Tylenol & OTC rx...  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 ~  4/14:  on Celexa20Bid; prev intol to Pristiq... She is under a lot of stress & rec to use ALPRAZOLAM 0.5mg  tid as needed. ~  12/14: stable on Celexa20Bid, and Alpraz 0.5mg  prn...  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 ~  GYN= DrRevard now w/ hx endometriosis, & abn PAP as noted. ~  GI by DrPatterson and up to date w/ neg colon in 2002 ~  she takes numerous supplements: Calcium, Magnesium, Vit C, Vit D, CoQ10...    Past Surgical History  Procedure Laterality Date  .  Cholecystectomy  1988  . Laser surgery for endometriosis  1993    Dr. Jamey Ripa  . Abdominal hysterectomy  1993  . Hemorrhoid surgery  2001    Dr. Jamey Ripa  . C-5, c-6 & c-6, c-7 anterior cervical disectomy and fusion with allograft and plating  01/2005    Dr. Ophelia Charter    Outpatient Encounter Prescriptions as of 06/28/2013  Medication Sig  . ALPRAZolam (XANAX) 0.5 MG tablet Take 1/2 to 1 tablet by mouth three times daily as needed for nerves  . aspirin 81 MG tablet Take 81 mg by mouth 2 (two) times daily.  . beta carotene w/minerals (OCUVITE) tablet Take 1 tablet by mouth daily.  . Biotin 1000 MCG tablet Take 1,000 mcg by mouth daily.  . Calcium Carbonate-Vitamin D (CALCIUM 600+D) 600-400 MG-UNIT per tablet Take 1 tablet by mouth daily.    . cholecalciferol (VITAMIN D) 1000 UNITS tablet Take 1,000 Units by mouth 2 (two) times daily.    . citalopram (CELEXA) 20 MG tablet Take 1 tablet (20 mg total) by mouth 2 (two) times daily.  . Coenzyme Q10 100 MG TABS Take 1 tablet by mouth daily.    . fish oil-omega-3 fatty acids 1000 MG capsule Take 2 g by mouth 2 (two) times daily.  Marland Kitchen guaiFENesin (MUCINEX) 600 MG 12 hr tablet Take 600 mg by mouth daily.  Marland Kitchen ibuprofen (ADVIL,MOTRIN) 200 MG tablet Per bottle as needed   . levalbuterol (XOPENEX HFA) 45 MCG/ACT inhaler Inhale 1-2 puffs into the lungs every 6 (six) hours as needed for wheezing.  . Magnesium 250 MG TABS Take 1 tablet by mouth daily.    . polyethylene glycol (MIRALAX / GLYCOLAX) packet Take 17 g by mouth daily.    . Pseudoephedrine-Acetaminophen (SINUS RELIEF PO) Take by mouth as needed.  Marland Kitchen tiZANidine (ZANAFLEX) 4 MG tablet TAKE ONE TABLET BY MOUTH THREE TIMES DAILY AS DIRECTED.  Marland Kitchen tiZANidine (ZANAFLEX) 4 MG tablet TAKE ONE TABLET BY MOUTH THREE TIMES DAILY AS DIRECTED.  Marland Kitchen traMADol (ULTRAM) 50 MG tablet Take 1 tablet (50 mg total) by mouth 3 (three) times daily as needed for pain.  . vitamin C (ASCORBIC ACID) 500 MG tablet Take 500 mg by mouth  daily.      Allergies  Allergen Reactions  . Codeine     REACTION: itching--nervous  . Pristiq [Desvenlafaxine]     Nausea, headahce, increased BP and palpitations    Current Medications, Allergies, Past Medical History, Past Surgical History, Family History, and Social History were reviewed in [employer] record.   Review of Systems         The patient complains of dyspnea on exertion, back pain, muscle cramps, and arthritis.  The patient denies fever, chills, sweats, anorexia, fatigue, weakness, malaise, weight loss, sleep disorder, blurring, diplopia, eye irritation, eye discharge, vision loss, eye pain, photophobia, earache, ear discharge, tinnitus, decreased hearing, nasal congestion, nosebleeds, sore throat, hoarseness, chest pain, palpitations, syncope, orthopnea, PND, peripheral edema, cough, dyspnea at rest, excessive sputum, hemoptysis, wheezing, pleurisy, nausea, vomiting, diarrhea, constipation, change in bowel habits, abdominal pain, melena, hematochezia, jaundice, gas/bloating,  indigestion/heartburn, dysphagia, odynophagia, dysuria, hematuria, urinary frequency, urinary hesitancy, nocturia, incontinence, joint pain, joint swelling, muscle weakness, stiffness, sciatica, restless legs, leg pain at night, leg pain with exertion, rash, itching, dryness, suspicious lesions, paralysis, paresthesias, seizures, tremors, vertigo, transient blindness, frequent falls, frequent headaches, difficulty walking, depression, anxiety, memory loss, confusion, cold intolerance, heat intolerance, polydipsia, polyphagia, polyuria, unusual weight change, abnormal bruising, bleeding, enlarged lymph nodes, urticaria, allergic rash, hay fever, and recurrent infections.   Objective:   Physical Exam     WD, Overweight, 57 y/o WF in NAD... Pos trigger points... GENERAL:  Alert & oriented; pleasant & cooperative... HEENT:  [place]/AT, EOM-wnl, PERRLA, Fundi-benign, EACs-clear, TMs-wnl,  NOSE-clear, THROAT-clear & wnl. NECK:  Supple w/ decrROM & scar of surg; no JVD; normal carotid impulses w/o bruits; no thyromegaly or nodules palpated; no lymphadenopathy. CHEST:  Clear to P & A; without wheezes/ rales/ or rhonchi. HEART:  Regular Rhythm; without murmurs/ rubs/ or gallops. ABDOMEN:  Obese, soft & nontender; normal bowel sounds; no organomegaly or masses detected. EXT: without deformities, mild arthritic changes; no varicose veins/ +venous insuffic/ tr edema. NEURO:  CN's intact; motor testing normal; sensory testing normal; gait normal & balance OK. DERM:  No lesions noted; no rash etc...  RADIOLOGY DATA:  Reviewed in the EPIC EMR & discussed w/ the patient...  LABORATORY DATA:  Reviewed in the EPIC EMR & discussed w/ the patient...   Assessment & Plan:    Fibromyalgia>  +trigger points; she never tried the Lyrica; she had another rheum consult w/ DrHawkes=> she declined to treat the pt for this condition; pt is now requesting to start on the Lyrica as a trial- samples of 50mg => 75mg  given...   Hx CWP> from lifting box, assoc FM, anxiety & can't get a DB; rec to use rest, heat, Tramadol, Alprazolam Tid=> improved...  Asthmatic Bronchitis>  Her breathing has been OK, but she notes some difficulty w/ the pollen- we discussed OTC meds.  HYPERLIPID>  Needs better low chol/ low fat diet & weight reduction; f/u FLP reviewed & wt reduction is the key...  Obesity>  We discussed calorie restriction, diet & exercise; consider Bariatic surg w/ BMI=41...  GI> GERD, IBS>  Stable on OTC meds as noted...  DJD, DDD, etc>  She has had CSpine surg from [facility] in 2006...  Anxiety/ Depression>  She switched from Celexa to Pristiq & back, she likes the Celexa Rx... Under a lot of stress & we wrote for prn ALPRAZOLAM.   Patient's Medications  New Prescriptions   FLUOCINONIDE-EMOLLIENT (LIDEX-E) 0.05 % CREAM    Apply to rash as directed   PREGABALIN (LYRICA) 75 MG CAPSULE    Take 1  capsule (75 mg total) by mouth 2 (two) times daily.  Previous Medications   ALPRAZOLAM (XANAX) 0.5 MG TABLET    Take 1/2 to 1 tablet by mouth three times daily as needed for nerves   ASPIRIN 81 MG TABLET    Take 81 mg by mouth 2 (two) times daily.   BETA CAROTENE W/MINERALS (OCUVITE) TABLET    Take 1 tablet by mouth daily.   BIOTIN 1000 MCG TABLET    Take 1,000 mcg by mouth daily.   CALCIUM CARBONATE-VITAMIN D (CALCIUM 600+D) 600-400 MG-UNIT PER TABLET    Take 1 tablet by mouth daily.     CHOLECALCIFEROL (VITAMIN D) 1000 UNITS TABLET    Take 1,000 Units by mouth 2 (two) times daily.     CITALOPRAM (CELEXA) 20 MG TABLET  Take 1 tablet (20 mg total) by mouth 2 (two) times daily.   COENZYME Q10 100 MG TABS    Take 1 tablet by mouth daily.     GUAIFENESIN (MUCINEX) 600 MG 12 HR TABLET    Take 600 mg by mouth daily.   IBUPROFEN (ADVIL,MOTRIN) 200 MG TABLET    Per bottle as needed    KRILL OIL 1000 MG CAPS    Take 1 capsule by mouth daily.   MAGNESIUM 250 MG TABS    Take 1 tablet by mouth daily.     POLYETHYLENE GLYCOL (MIRALAX / GLYCOLAX) PACKET    Take 17 g by mouth daily.     PSEUDOEPHEDRINE-ACETAMINOPHEN (SINUS RELIEF PO)    Take by mouth as needed.   TIZANIDINE (ZANAFLEX) 4 MG TABLET    TAKE ONE TABLET BY MOUTH THREE TIMES DAILY AS DIRECTED.   TRAMADOL (ULTRAM) 50 MG TABLET    Take 1 tablet (50 mg total) by mouth 3 (three) times daily as needed for pain.   VITAMIN C (ASCORBIC ACID) 500 MG TABLET    Take 500 mg by mouth daily.    Modified Medications   No medications on file  Discontinued Medications   FISH OIL-OMEGA-3 FATTY ACIDS 1000 MG CAPSULE    Take 2 g by mouth 2 (two) times daily.   LEVALBUTEROL (XOPENEX HFA) 45 MCG/ACT INHALER    Inhale 1-2 puffs into the lungs every 6 (six) hours as needed for wheezing.   TIZANIDINE (ZANAFLEX) 4 MG TABLET    TAKE ONE TABLET BY MOUTH THREE TIMES DAILY AS DIRECTED.

## 2013-06-28 NOTE — Patient Instructions (Signed)
Today we updated your med list in our EPIC system...    Continue your current medications the same...  We changed your cortisone cream to the Generic LIDEX-E cream- apply to rash on hands at bedtime as directed...  We also wrote for Lyrica to try for the fibromyalgia>>    Start w/ 50mg  per day, then increase to 75mg /d & try to advance to 75mg  twice daily...   Call for any questions...  We will refer your chart to DrPatterson for a routine colonoscopy due...  Let's plan a follow up visit in 34mo, sooner if needed for problems.Marland KitchenMarland Kitchen

## 2013-07-08 ENCOUNTER — Encounter: Payer: PRIVATE HEALTH INSURANCE | Admitting: Gastroenterology

## 2013-07-20 ENCOUNTER — Telehealth: Payer: Self-pay | Admitting: Pulmonary Disease

## 2013-07-20 NOTE — Telephone Encounter (Signed)
I spoke with the pt and she states that lyrica needs a PA. I found the form and called to initiate PA at [phone], [employer]. This has been faxed to [employer] fax. I will forward message to leigh to look out for this.  Carron Curie, CMA

## 2013-07-29 ENCOUNTER — Ambulatory Visit (AMBULATORY_SURGERY_CENTER): Payer: 59

## 2013-07-29 VITALS — Ht 68.0 in | Wt 271.2 lb

## 2013-07-29 DIAGNOSIS — Z1211 Encounter for screening for malignant neoplasm of colon: Secondary | ICD-10-CM

## 2013-07-29 MED ORDER — MOVIPREP 100 G PO SOLR: ORAL | Status: DC

## 2013-08-02 NOTE — Telephone Encounter (Signed)
1.  Pt states that she is needing the lyrica refill starting tomorrow.  Pt would like to know what the status of this is.  2.  Also states she needs a refill on citalopram.  Please call pt at  [phone].  Sheila Dawson

## 2013-08-04 MED ORDER — CITALOPRAM HYDROBROMIDE 40 MG PO TABS: 40.0000 mg | ORAL_TABLET | Freq: Every day | ORAL | Status: DC

## 2013-08-04 NOTE — Telephone Encounter (Signed)
Called [phone] for the PA for the lyrica and the citalopram.   lyrica has been approved from 08/04/2013 thru 08/04/2014.   For the citalopram they will not cover the 20 mg  Bid.  i have spoken with SN and he is ok to change this to 40 mg  Daily.  i called and spoke with the pt and she is aware of the approval and the change in the citalopram.  New rx has been called in and pt is aware to call for any concerns.   Nothing further is needed.

## 2013-08-04 NOTE — Telephone Encounter (Signed)
Pt is wanting to speak with someone today regarding the lyrica Rx.  Pt can be reached at [phone].  Sheila Dawson

## 2013-08-05 ENCOUNTER — Encounter: Payer: PRIVATE HEALTH INSURANCE | Admitting: Gastroenterology

## 2013-08-12 ENCOUNTER — Encounter: Payer: Self-pay | Admitting: Gastroenterology

## 2013-08-12 ENCOUNTER — Ambulatory Visit (AMBULATORY_SURGERY_CENTER): Payer: 59 | Admitting: Gastroenterology

## 2013-08-12 VITALS — BP 153/92 | HR 67 | Temp 97.6°F | Resp 14 | Ht 68.0 in | Wt 271.0 lb

## 2013-08-12 DIAGNOSIS — Z1211 Encounter for screening for malignant neoplasm of colon: Secondary | ICD-10-CM

## 2013-08-12 MED ORDER — SODIUM CHLORIDE 0.9 % IV SOLN: 500.0000 mL | INTRAVENOUS | Status: DC

## 2013-08-12 NOTE — Patient Instructions (Signed)
YOU HAD AN ENDOSCOPIC PROCEDURE TODAY AT THE [facility]: Refer to the procedure report that was given to you for any specific questions about what was found during the examination.  If the procedure report does not answer your questions, please call your gastroenterologist to clarify.  If you requested that your care partner not be given the details of your procedure findings, then the procedure report has been included in a sealed envelope for you to review at your convenience later.  YOU SHOULD EXPECT: Some feelings of bloating in the abdomen. Passage of more gas than usual.  Walking can help get rid of the air that was put into your GI tract during the procedure and reduce the bloating. If you had a lower endoscopy (such as a colonoscopy or flexible sigmoidoscopy) you may notice spotting of blood in your stool or on the toilet paper. If you underwent a bowel prep for your procedure, then you may not have a normal bowel movement for a few days.  DIET: Your first meal following the procedure should be a light meal and then it is ok to progress to your normal diet.  A half-sandwich or bowl of soup is an example of a good first meal.  Heavy or fried foods are harder to digest and may make you feel nauseous or bloated.  Likewise meals heavy in dairy and vegetables can cause extra gas to form and this can also increase the bloating.  Drink plenty of fluids but you should avoid alcoholic beverages for 24 hours.  ACTIVITY: Your care partner should take you home directly after the procedure.  You should plan to take it easy, moving slowly for the rest of the day.  You can resume normal activity the day after the procedure however you should NOT DRIVE or use heavy machinery for 24 hours (because of the sedation medicines used during the test).    SYMPTOMS TO REPORT IMMEDIATELY: A gastroenterologist can be reached at any hour.  During normal business hours, 8:30 AM to 5:00 PM Monday through Friday,  call [phone].  After hours and on weekends, please call the GI answering service at [phone] who will take a message and have the physician on call contact you.   Following lower endoscopy (colonoscopy or flexible sigmoidoscopy):  Excessive amounts of blood in the stool  Significant tenderness or worsening of abdominal pains  Swelling of the abdomen that is new, acute  Fever of 100F or higher    FOLLOW UP: If any biopsies were taken you will be contacted by phone or by letter within the next 1-3 weeks.  Call your gastroenterologist if you have not heard about the biopsies in 3 weeks.  Our staff will call the home number listed on your records the next business day following your procedure to check on you and address any questions or concerns that you may have at that time regarding the information given to you following your procedure. This is a courtesy call and so if there is no answer at the home number and we have not heard from you through the emergency physician on call, we will assume that you have returned to your regular daily activities without incident.  SIGNATURES/CONFIDENTIALITY: You and/or your care partner have signed paperwork which will be entered into your electronic medical record.  These signatures attest to the fact that that the information above on your After Visit Summary has been reviewed and is understood.  Full responsibility of the confidentiality   of this discharge information lies with you and/or your care-partner.     

## 2013-08-12 NOTE — Op Note (Signed)
[place]  [address] [employer] [address] [place], [id number]   COLONOSCOPY PROCEDURE REPORT  PATIENT: Dawson, Sheila  MR#: [id number] BIRTHDATE: 1956/05/20 , 69  yrs. old GENDER: Female ENDOSCOPIST: Sable Feil, MD, [facility] REFERRED BY: PROCEDURE DATE:  08/12/2013 PROCEDURE:   Colonoscopy, screening First Screening Colonoscopy - Avg.  risk and is 50 yrs.  old or older Yes.      History of Adenoma - Now for follow-up colonoscopy & has been > or = to 3 yrs.  N/A ASA CLASS:   Class II INDICATIONS:average risk screening. MEDICATIONS: propofol (Diprivan) 350mg  IV  DESCRIPTION OF PROCEDURE:   After the risks benefits and alternatives of the procedure were thoroughly explained, informed consent was obtained.  A digital rectal exam revealed no abnormalities of the rectum.   The LB [id number]  endoscope was introduced through the anus and advanced to the cecum, which was identified by both the appendix and ileocecal valve. No adverse events experienced.   Limited by poor preparation.   The quality of the prep was poor, using MoviPrep  The instrument was then slowly withdrawn as the colon was fully examined.      COLON FINDINGS: A normal appearing cecum, ileocecal valve, and appendiceal orifice were identified.  The ascending, hepatic flexure, transverse, splenic flexure, descending, sigmoid colon and rectum appeared unremarkable.  No polyps or cancers were seen. Retroflexed views revealed no abnormalities. The time to cecum=8 minutes 51 seconds.  Withdrawal time=9 minutes 44 seconds.  The scope was withdrawn and the procedure completed. COMPLICATIONS: There were no complications.  ENDOSCOPIC IMPRESSION: Normal colon...very limited exam per horrible colom prep !!!!  RECOMMENDATIONS: Colonoscopy 3 years with "double prep" !!!!!   eSigned:  Sable Feil, MD, [facility] 08/12/2013 2:39 PM   cc: Noralee Space, MD   PATIENT NAME:  Sheila, Dawson MR#: [id number]

## 2013-08-12 NOTE — Progress Notes (Signed)
A/ox3 pleased with MAC, report to Karen RN 

## 2013-08-15 ENCOUNTER — Telehealth: Payer: Self-pay | Admitting: *Deleted

## 2013-08-15 NOTE — Telephone Encounter (Signed)
No answer, message left for the patient. 

## 2013-09-29 ENCOUNTER — Telehealth: Payer: Self-pay | Admitting: Pulmonary Disease

## 2013-09-29 NOTE — Telephone Encounter (Signed)
Pt can also be reached at [phone] if unable to reach on cell phone.  Satira Anis

## 2013-09-30 NOTE — Telephone Encounter (Signed)
Ret call.  Sheila Dawson

## 2013-09-30 NOTE — Telephone Encounter (Signed)
lmomtcb x1 

## 2013-10-03 NOTE — Telephone Encounter (Signed)
Called and spoke with pt and she is aware that SN will no longer be seeing primary care as of April 1.  Pt will call me back in a day or two to let me know who she would like to be set up with for new primary care.

## 2013-10-04 NOTE — Telephone Encounter (Signed)
Spoke with pt. She reports she is checking with another office and will let us know. She will call back once she figures out who she wants to see. Will sign off message

## 2013-11-02 ENCOUNTER — Ambulatory Visit: Payer: PRIVATE HEALTH INSURANCE | Admitting: Pulmonary Disease

## 2013-11-17 ENCOUNTER — Other Ambulatory Visit: Payer: Self-pay | Admitting: Pulmonary Disease

## 2013-12-20 ENCOUNTER — Telehealth: Payer: Self-pay | Admitting: Pulmonary Disease

## 2013-12-20 MED ORDER — METHYLPREDNISOLONE 4 MG PO TABS: ORAL_TABLET | ORAL | Status: DC

## 2013-12-20 MED ORDER — AMOXICILLIN-POT CLAVULANATE 875-125 MG PO TABS: 1.0000 | ORAL_TABLET | Freq: Two times a day (BID) | ORAL | Status: DC

## 2013-12-20 NOTE — Telephone Encounter (Signed)
LMTCB

## 2013-12-20 NOTE — Telephone Encounter (Signed)
lmomtcb x1 for pt 

## 2013-12-20 NOTE — Telephone Encounter (Signed)
Spoke with pt C/o upper resp issues-- sore throat, change in color of mucus to [place] Manley, cough, wheezing, feeling feverish at times x 3-4days Using Mucus Relief OTC  Patient requesting something be called into pharmacy   [facility] Allergies  Allergen Reactions  . Codeine     REACTION: itching--nervous  . Pristiq [Desvenlafaxine]     Nausea, headahce, increased BP and palpitations   Please advise Dr Lenna Gilford. Thanks.

## 2013-12-20 NOTE — Telephone Encounter (Signed)
Per SN---   Call in augmentin 875 mg  #14  1 PO BID Align once daily  Add mucinex 600 mg  2 po bid Increase fluid intake Medrol dosepak #1  Take as directed

## 2013-12-20 NOTE — Telephone Encounter (Signed)
Pt returning call.Sheila Dawson ° °

## 2013-12-20 NOTE — Telephone Encounter (Signed)
Called spoke with pt. Aware RX has been sent in. Nothing further needed 

## 2013-12-21 ENCOUNTER — Telehealth: Payer: Self-pay | Admitting: Pulmonary Disease

## 2013-12-21 MED ORDER — LEVOFLOXACIN 500 MG PO TABS: 500.0000 mg | ORAL_TABLET | Freq: Every day | ORAL | Status: DC

## 2013-12-21 NOTE — Telephone Encounter (Signed)
Per SN---  It usually does not do that.   Its a broad spectrum antibiotic Does she want to try it again today and we can call in phenergan  25 mg  #12   1 po every 4-6 hours prn nausea Or we can send in levaquin 50mg   #7  1 daily

## 2013-12-21 NOTE — Telephone Encounter (Signed)
Pt returned call.  Having reception problems on cell phone.  Sheila Dawson

## 2013-12-21 NOTE — Telephone Encounter (Signed)
LMTCB

## 2013-12-21 NOTE — Telephone Encounter (Signed)
Called spoke with pt. Aware of recs. She wants new ABX called in. I have done so. Nothing further needed

## 2013-12-21 NOTE — Telephone Encounter (Signed)
Called spoke w/ pt. She reports after taking augmentin last night she was up vomiting. She did take align after the ABX. Please advise SN thanks  Allergies  Allergen Reactions  . Codeine     REACTION: itching--nervous  . Pristiq [Desvenlafaxine]     Nausea, headahce, increased BP and palpitations     Current Outpatient Prescriptions on File Prior to Visit  Medication Sig Dispense Refill  . ALPRAZolam (XANAX) 0.5 MG tablet Take 1/2 to 1 tablet by mouth three times daily as needed for nerves  50 tablet  5  . amoxicillin-clavulanate (AUGMENTIN) 875-125 MG per tablet Take 1 tablet by mouth 2 (two) times daily.  14 tablet  0  . aspirin 81 MG tablet Take 81 mg by mouth 2 (two) times daily.      . beta carotene w/minerals (OCUVITE) tablet Take 1 tablet by mouth daily.      . Biotin 1000 MCG tablet Take 1,000 mcg by mouth daily.      . Calcium Carbonate-Vitamin D (CALCIUM 600+D) 600-400 MG-UNIT per tablet Take 1 tablet by mouth daily.        . cholecalciferol (VITAMIN D) 1000 UNITS tablet Take 1,000 Units by mouth 2 (two) times daily.        . citalopram (CELEXA) 40 MG tablet Take 1 tablet (40 mg total) by mouth daily.  30 tablet  6  . Coenzyme Q10 100 MG TABS Take 1 tablet by mouth daily.        . diphenhydramine-acetaminophen (TYLENOL PM) 25-500 MG TABS Take 1 tablet by mouth at bedtime as needed.      Marland Kitchen FIBER PO Take by mouth daily.      . fluocinonide-emollient (LIDEX-E) 0.05 % cream Apply to rash as directed  30 g  11  . guaiFENesin (MUCINEX) 600 MG 12 hr tablet Take 600 mg by mouth daily.      Marland Kitchen ibuprofen (ADVIL,MOTRIN) 200 MG tablet Per bottle as needed       . Krill Oil 1000 MG CAPS Take 1 capsule by mouth daily. Take 300 mg daily      . Magnesium 250 MG TABS Take 1 tablet by mouth daily.        . methylPREDNISolone (MEDROL) 4 MG tablet Take as directed  21 tablet  0  . MOVIPREP 100 G SOLR Moviprep as directed / no substitutions  1 kit  0  . Multiple Minerals (CALCIUM/MAGNESIUM/ZINC PO)  Take by mouth daily. 1032m/400mg/25      . polyethylene glycol (MIRALAX / GLYCOLAX) packet Take 17 g by mouth daily.        . pregabalin (LYRICA) 75 MG capsule Take 1 capsule (75 mg total) by mouth 2 (two) times daily.  60 capsule  11  . Pseudoephedrine-Acetaminophen (SINUS RELIEF PO) Take by mouth as needed.      .Marland KitchentiZANidine (ZANAFLEX) 4 MG tablet TAKE ONE TABLET BY MOUTH THREE TIMES DAILY AS DIRECTED.  270 tablet  0  . vitamin C (ASCORBIC ACID) 500 MG tablet Take 500 mg by mouth daily.         No current facility-administered medications on file prior to visit.

## 2014-01-13 ENCOUNTER — Telehealth: Payer: Self-pay | Admitting: Pulmonary Disease

## 2014-01-13 NOTE — Telephone Encounter (Signed)
Per pt's chart, last CPX was 10.18.13 No Tdap on file, so likely due for this  Called spoke with patient and discussed the above with her.  Pt stated she is now established with her PCP is Dr Vicenta Aly w/ [place] [phone]).  Pt stated that we will be receiving a records request from that provider.  Advised pt we will gladly send anything needed.  Nothing further needed; will sign off.

## 2014-03-28 ENCOUNTER — Emergency Department (HOSPITAL_COMMUNITY)
Admission: EM | Admit: 2014-03-28 | Discharge: 2014-03-28 | Disposition: A | Discharge status: Home / Self Care | Payer: 59 | Attending: Emergency Medicine | Admitting: Emergency Medicine

## 2014-03-28 ENCOUNTER — Emergency Department (HOSPITAL_COMMUNITY): Payer: 59

## 2014-03-28 ENCOUNTER — Encounter (HOSPITAL_COMMUNITY): Payer: Self-pay | Admitting: Emergency Medicine

## 2014-03-28 DIAGNOSIS — F411 Generalized anxiety disorder: Secondary | ICD-10-CM | POA: Diagnosis not present

## 2014-03-28 DIAGNOSIS — S161XXA Strain of muscle, fascia and tendon at neck level, initial encounter: Secondary | ICD-10-CM

## 2014-03-28 DIAGNOSIS — Z8719 Personal history of other diseases of the digestive system: Secondary | ICD-10-CM | POA: Insufficient documentation

## 2014-03-28 DIAGNOSIS — S199XXA Unspecified injury of neck, initial encounter: Secondary | ICD-10-CM

## 2014-03-28 DIAGNOSIS — Z7982 Long term (current) use of aspirin: Secondary | ICD-10-CM | POA: Insufficient documentation

## 2014-03-28 DIAGNOSIS — Y9389 Activity, other specified: Secondary | ICD-10-CM | POA: Insufficient documentation

## 2014-03-28 DIAGNOSIS — S4980XA Other specified injuries of shoulder and upper arm, unspecified arm, initial encounter: Secondary | ICD-10-CM | POA: Diagnosis not present

## 2014-03-28 DIAGNOSIS — Z79899 Other long term (current) drug therapy: Secondary | ICD-10-CM | POA: Diagnosis not present

## 2014-03-28 DIAGNOSIS — IMO0001 Reserved for inherently not codable concepts without codable children: Secondary | ICD-10-CM | POA: Diagnosis not present

## 2014-03-28 DIAGNOSIS — Z8659 Personal history of other mental and behavioral disorders: Secondary | ICD-10-CM | POA: Diagnosis not present

## 2014-03-28 DIAGNOSIS — E669 Obesity, unspecified: Secondary | ICD-10-CM | POA: Diagnosis not present

## 2014-03-28 DIAGNOSIS — Z8679 Personal history of other diseases of the circulatory system: Secondary | ICD-10-CM | POA: Diagnosis not present

## 2014-03-28 DIAGNOSIS — S46909A Unspecified injury of unspecified muscle, fascia and tendon at shoulder and upper arm level, unspecified arm, initial encounter: Secondary | ICD-10-CM | POA: Insufficient documentation

## 2014-03-28 DIAGNOSIS — Z872 Personal history of diseases of the skin and subcutaneous tissue: Secondary | ICD-10-CM | POA: Insufficient documentation

## 2014-03-28 DIAGNOSIS — S139XXA Sprain of joints and ligaments of unspecified parts of neck, initial encounter: Secondary | ICD-10-CM | POA: Diagnosis not present

## 2014-03-28 DIAGNOSIS — S0993XA Unspecified injury of face, initial encounter: Secondary | ICD-10-CM | POA: Diagnosis present

## 2014-03-28 DIAGNOSIS — J45909 Unspecified asthma, uncomplicated: Secondary | ICD-10-CM | POA: Insufficient documentation

## 2014-03-28 DIAGNOSIS — Y92410 Unspecified street and highway as the place of occurrence of the external cause: Secondary | ICD-10-CM | POA: Insufficient documentation

## 2014-03-28 MED ORDER — IBUPROFEN 800 MG PO TABS: 800.0000 mg | ORAL_TABLET | Freq: Three times a day (TID) | ORAL | Status: DC

## 2014-03-28 MED ORDER — CYCLOBENZAPRINE HCL 10 MG PO TABS: 10.0000 mg | ORAL_TABLET | Freq: Two times a day (BID) | ORAL | Status: DC | PRN

## 2014-03-28 MED ORDER — HYDROCODONE-ACETAMINOPHEN 5-325 MG PO TABS: 1.0000 | ORAL_TABLET | ORAL | Status: DC | PRN

## 2014-03-28 MED ORDER — ONDANSETRON HCL 4 MG PO TABS: 4.0000 mg | ORAL_TABLET | Freq: Four times a day (QID) | ORAL | Status: DC

## 2014-03-28 MED ORDER — IBUPROFEN 800 MG PO TABS
800.0000 mg | ORAL_TABLET | Freq: Once | ORAL | Status: AC
  Administered 2014-03-28: 800 mg via ORAL
  Filled 2014-03-28: qty 1

## 2014-03-28 NOTE — ED Notes (Signed)
No changes. Up to b/r. Steady gait. Pending d/c.
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
## 2014-03-28 NOTE — ED Notes (Signed)
Alert, NAD, calm, interactive, steady gait, "feel better", out with family, given Rx x4
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
## 2014-03-28 NOTE — ED Notes (Addendum)
EDPA at [facility]. Pt log rolled. Spinal precautions maintained. LSB removed. c-collar remains. TTP at c-spine. L ear hurts more. L ear red, otherwise unremarkable. Skin intact. Tolerated log roll. (denies: sob, nausea or dizziness). No dyspnea noted. Pt was unable to tolerate auto BPs and requested only manual BPs.

## 2014-03-30 NOTE — ED Provider Notes (Signed)
Medical screening examination/treatment/procedure(s) were performed by non-physician practitioner and as supervising physician I was immediately available for consultation/collaboration.   EKG Interpretation None       Charlesetta Shanks, MD 03/30/14 [phone]

## 2014-04-17 ENCOUNTER — Other Ambulatory Visit (HOSPITAL_COMMUNITY): Payer: Self-pay | Admitting: Nurse Practitioner

## 2014-04-17 DIAGNOSIS — Z1231 Encounter for screening mammogram for malignant neoplasm of breast: Secondary | ICD-10-CM

## 2014-04-17 DIAGNOSIS — Z780 Asymptomatic menopausal state: Secondary | ICD-10-CM

## 2016-05-27 ENCOUNTER — Encounter: Payer: Self-pay | Admitting: Gastroenterology

## 2017-01-07 ENCOUNTER — Encounter (INDEPENDENT_AMBULATORY_CARE_PROVIDER_SITE_OTHER): Payer: Self-pay

## 2017-01-07 ENCOUNTER — Encounter: Payer: Self-pay | Admitting: Gastroenterology

## 2017-01-07 ENCOUNTER — Ambulatory Visit (INDEPENDENT_AMBULATORY_CARE_PROVIDER_SITE_OTHER): Payer: 59 | Admitting: Gastroenterology

## 2017-01-07 VITALS — BP 100/70 | HR 80 | Ht 68.0 in | Wt 233.6 lb

## 2017-01-07 DIAGNOSIS — R1084 Generalized abdominal pain: Secondary | ICD-10-CM

## 2017-01-07 DIAGNOSIS — K5909 Other constipation: Secondary | ICD-10-CM

## 2017-01-07 MED ORDER — NA SULFATE-K SULFATE-MG SULF 17.5-3.13-1.6 GM/177ML PO SOLN: 1.0000 | Freq: Once | ORAL | 0 refills | Status: AC

## 2017-01-07 NOTE — Patient Instructions (Addendum)
If you are age 62 or older, your body mass index should be between 23-30. Your Body mass index is 35.52 kg/m. If this is out of the aforementioned range listed, please consider follow up with your Primary Care Provider.  If you are age 19 or younger, your body mass index should be between 19-25. Your Body mass index is 35.52 kg/m. If this is out of the aformentioned range listed, please consider follow up with your Primary Care Provider.   You have been scheduled for a colonoscopy. Please follow written instructions given to you at your visit today.  Please pick up your prep supplies at the pharmacy within the next 1-3 days. If you use inhalers (even only as needed), please bring them with you on the day of your procedure. Your physician has requested that you go to www.startemmi.com and enter the access code given to you at your visit today. This web site gives a general overview about your procedure. However, you should still follow specific instructions given to you by our office regarding your preparation for the procedure.  Thank you for choosing [facility]  Dr Wilfrid Lund III

## 2017-01-07 NOTE — Progress Notes (Signed)
[facility] Consult Note:  History: Sheila Dawson 01/07/2017  Referring physician: Vicenta Aly, [place]  Reason for consult/chief complaint: Colon Cancer Screening (Recall, lower abdominal pain feel like intestinal cramping; stools pencil thin)   Subjective  HPI:  This is a 61 year old woman referred by primary care noted above for recent onset of abdominal pain and altered bowel habits. She has long-standing fibromyalgia and reports that years ago she had some abdominal pain and diarrhea that was labeled IBS. Sheila Dawson had seen Dr. Sharlett Iles for a routine colonoscopy in January 2015. The preparation was quite poor, but she attributes that to having been on fiber at the time. She says her bowel habits and IBS symptoms seem to improve, but over the last few months she has had bandlike crampy lower abdominal pain and more frequent constipation. In retrospect, this may have recurred around the same time as a change from SSRI to her SNRI (Cymbalta) several months ago. She has a lot of gas and sometimes the stool is thin. There's been no rectal bleeding, her appetite is generally good and her weight stable. Sheila Dawson reports that a recent CT scan ordered by her PCP did not show a cause for this, even though there was some concern that it may have been diverticulitis. She says her white blood cell count was elevated, so she was given antibiotics because of that. Those records are not available for [place] today.  ROS:  Review of Systems  Constitutional: Negative for appetite change and unexpected weight change.  HENT: Negative for mouth sores and voice change.   Eyes: Negative for pain and redness.  Respiratory: Negative for cough and shortness of breath.   Cardiovascular: Negative for chest pain and palpitations.  Genitourinary: Negative for dysuria and hematuria.  Musculoskeletal: Positive for arthralgias and myalgias.  Skin: Negative for pallor and rash.  Neurological:  Negative for weakness and headaches.  Hematological: Negative for adenopathy.  Psychiatric/Behavioral: Positive for dysphoric mood.     Past Medical History: Past Medical History:  Diagnosis Date  . Anxiety   . Asthmatic bronchitis   . Basal cell adenoma    forehead  . DDD (degenerative disc disease)   . Edema   . Fibromyalgia   . GERD (gastroesophageal reflux disease)   . Hyperlipidemia   . IBS (irritable bowel syndrome)   . Migraine headache   . Obesity   . Sebaceous cyst   . Venous insufficiency      Past Surgical History: Past Surgical History:  Procedure Laterality Date  . ABDOMINAL HYSTERECTOMY  1993  . C-5, C-6 & C-6, C-7 anterior cervical disectomy and fusion with allograft and plating  01/2005   Dr. Lorin Mercy  . CHOLECYSTECTOMY  1988  . HEMORRHOID SURGERY  2001   Dr. Margot Chimes  . laser surgery for endometriosis  1993   Dr. Margot Chimes  . MOHS SURGERY       Family History: History reviewed. No pertinent family history.  Social History: Social History   Social History  . Marital status: Married    Spouse name: raymond x 27 yrs  . Number of children: 2  . Years of education: N/A   Occupational History  . 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   Social History Main Topics  . Smoking status: Never Smoker  . Smokeless tobacco: Never Used  . Alcohol use No     Comment: social use  . Drug use: No  . Sexual activity: Not Asked   Other Topics Concern  . None  Social History Narrative  . None    Allergies: Allergies  Allergen Reactions  . Pristiq [Desvenlafaxine] Nausea Only and Palpitations    Headache, increased BP   . Amoxicillin-Pot Clavulanate Nausea And Vomiting  . Codeine Itching  . Sulfa Antibiotics Hives and Itching  . Trazodone And Nefazodone Hives    Outpatient Meds: Current Outpatient Prescriptions  Medication Sig Dispense Refill  . aspirin 81 MG tablet Take 81 mg by mouth 2 (two) times daily.    . beta carotene w/minerals (OCUVITE) tablet  Take 1 tablet by mouth daily.    Marland Kitchen buPROPion (WELLBUTRIN XL) 300 MG 24 hr tablet Take 1 tablet by mouth daily.    . Calcium Carbonate-Vitamin D (CALCIUM 600+D) 600-400 MG-UNIT per tablet Take 1 tablet by mouth daily.      . cetirizine (ZYRTEC) 10 MG tablet Take 10 mg by mouth daily.    . cholecalciferol (VITAMIN D) 1000 UNITS tablet Take 1,000 Units by mouth 2 (two) times daily.      . Coenzyme Q10 100 MG TABS Take 1 tablet by mouth daily.      . diphenhydramine-acetaminophen (TYLENOL PM) 25-500 MG TABS Take 1 tablet by mouth at bedtime.     . DULoxetine (CYMBALTA) 60 MG capsule Take 60 mg by mouth daily.    Marland Kitchen guaiFENesin (MUCINEX) 600 MG 12 hr tablet Take 600 mg by mouth daily.    Marland Kitchen ibuprofen (ADVIL,MOTRIN) 200 MG tablet Take 600 mg by mouth daily as needed for moderate pain. Per bottle as needed    . Krill Oil 300 MG CAPS Take 300 mg by mouth daily.    . montelukast (SINGULAIR) 10 MG tablet Take 1 tablet by mouth daily.  11  . polyethylene glycol (MIRALAX / GLYCOLAX) packet Take 17 g by mouth daily.      Marland Kitchen tiZANidine (ZANAFLEX) 4 MG tablet Take 2-6 mg by mouth 3 (three) times daily. Takes 15m in am, 238min lunch and 78m72mn evening    . vitamin C (ASCORBIC ACID) 500 MG tablet Take 500 mg by mouth daily.      . Na Sulfate-K Sulfate-Mg Sulf 17.5-3.13-1.6 GM/180ML SOLN Take 1 kit by mouth once. 354 mL 0   No current facility-administered medications for this visit.       ___________________________________________________________________ Objective   Exam:  BP 100/70   Pulse 80   Ht '5\' 8"'  (1.727 m)   Wt 233 lb 9.6 oz (106 kg)   BMI 35.52 kg/m    General: this is a(n) Well-appearing woman   Eyes: sclera anicteric, no redness  ENT: oral mucosa moist without lesions, no cervical or supraclavicular lymphadenopathy, good dentition  CV: RRR without murmur, S1/S2, no JVD, no peripheral edema  Resp: clear to auscultation bilaterally, normal RR and effort noted  GI: soft, no   tenderness, with active bowel sounds. No guarding or palpable organomegaly noted.  Skin; warm and dry, no rash or jaundice noted  Neuro: awake, alert and oriented x 3. Normal gross motor function and fluent speech  No recent data for review as noted above  Colonoscopy or fourth 2015 was reviewed   Assessment: Encounter Diagnoses  Name Primary?  . Generalized abdominal pain Yes  . Other constipation     It is difficult to know for certain, but I wonder if these symptoms may recur to side effects from her change in medicines. Flare/evolution of her pre-existing IBS symptoms could be the cause as well. We discussed how IBS and fibromyalgia  often run together and there activity may mirror each other. I think a structural or neoplastic causes in the colon is less likely, but her last colonoscopy preparation was poor which limited visualization.   Plan:  Colonoscopy.   The benefits and risks of the planned procedure were described in detail with the patient or (when appropriate) their health care proxy.  Risks were outlined as including, but not limited to, bleeding, infection, perforation, adverse medication reaction leading to cardiac or pulmonary decompensation, or pancreatitis (if ERCP).  The limitation of incomplete mucosal visualization was also discussed.  No guarantees or warranties were given.  If unrevealing, refer back to primary care for consideration of changing her Cymbalta back to previous med and see if that improves things. She does not seem to feel that the Cymbalta has been a great help to her mood or pain syndrome yet.   Thank you for the courtesy of this consult.  Please call me with any questions or concerns.  Nelida Meuse III  CC: Vicenta Aly, [place]

## 2017-02-23 ENCOUNTER — Encounter: Payer: Self-pay | Admitting: Gastroenterology

## 2017-02-27 ENCOUNTER — Ambulatory Visit (AMBULATORY_SURGERY_CENTER): Payer: 59 | Admitting: Gastroenterology

## 2017-02-27 ENCOUNTER — Encounter: Payer: Self-pay | Admitting: Gastroenterology

## 2017-02-27 VITALS — BP 140/75 | HR 65 | Temp 96.9°F | Resp 15 | Ht 68.0 in | Wt 233.0 lb

## 2017-02-27 DIAGNOSIS — R1084 Generalized abdominal pain: Secondary | ICD-10-CM | POA: Diagnosis not present

## 2017-02-27 DIAGNOSIS — K5909 Other constipation: Secondary | ICD-10-CM | POA: Diagnosis not present

## 2017-02-27 DIAGNOSIS — D123 Benign neoplasm of transverse colon: Secondary | ICD-10-CM

## 2017-02-27 MED ORDER — SODIUM CHLORIDE 0.9 % IV SOLN: 500.0000 mL | INTRAVENOUS | Status: AC

## 2017-02-27 NOTE — Progress Notes (Signed)
A and O x3. Report to RN. Tolerated MAC anesthesia well.

## 2017-02-27 NOTE — Patient Instructions (Addendum)
YOU HAD AN ENDOSCOPIC PROCEDURE TODAY AT THE [facility]:   Refer to the procedure report that was given to you for any specific questions about what was found during the examination.  If the procedure report does not answer your questions, please call your gastroenterologist to clarify.  If you requested that your care partner not be given the details of your procedure findings, then the procedure report has been included in a sealed envelope for you to review at your convenience later.  YOU SHOULD EXPECT: Some feelings of bloating in the abdomen. Passage of more gas than usual.  Walking can help get rid of the air that was put into your GI tract during the procedure and reduce the bloating. If you had a lower endoscopy (such as a colonoscopy or flexible sigmoidoscopy) you may notice spotting of blood in your stool or on the toilet paper. If you underwent a bowel prep for your procedure, you may not have a normal bowel movement for a few days.  Please Note:  You might notice some irritation and congestion in your nose or some drainage.  This is from the oxygen used during your procedure.  There is no need for concern and it should clear up in a day or so.  SYMPTOMS TO REPORT IMMEDIATELY:   Following lower endoscopy (colonoscopy or flexible sigmoidoscopy):  Excessive amounts of blood in the stool  Significant tenderness or worsening of abdominal pains  Swelling of the abdomen that is new, acute  Fever of 100F or higher  For urgent or emergent issues, a gastroenterologist can be reached at any hour by calling [phone].   DIET:  We do recommend a small meal at first, but then you may proceed to your regular diet.  Drink plenty of fluids but you should avoid alcoholic beverages for 24 hours.  ACTIVITY:  You should plan to take it easy for the rest of today and you should NOT DRIVE or use heavy machinery until tomorrow (because of the sedation medicines used during the test).     FOLLOW UP: Our staff will call the number listed on your records the next business day following your procedure to check on you and address any questions or concerns that you may have regarding the information given to you following your procedure. If we do not reach you, we will leave a message.  However, if you are feeling well and you are not experiencing any problems, there is no need to return our call.  We will assume that you have returned to your regular daily activities without incident.  If any biopsies were taken you will be contacted by phone or by letter within the next 1-3 weeks.  Please call us at [phone] if you have not heard about the biopsies in 3 weeks.   Await for biopsy results to determine next repeat Colonoscopy screening Polyps (handout given)  SIGNATURES/CONFIDENTIALITY: You and/or your care partner have signed paperwork which will be entered into your electronic medical record.  These signatures attest to the fact that that the information above on your After Visit Summary has been reviewed and is understood.  Full responsibility of the confidentiality of this discharge information lies with you and/or your care-partner. 

## 2017-02-27 NOTE — Op Note (Signed)
[facility] Patient Name: Sheila Dawson Procedure Date: 02/27/2017 12:55 PM MRN: [id number] Endoscopist: [place]. Loletha Carrow , MD Age: 61 Referring MD:  Date of Birth: March 09, 1956 Gender: Female Account #: [id number] Procedure:                Colonoscopy Indications:              Lower abdominal pain, Constipation Medicines:                Monitored Anesthesia Care Procedure:                Pre-Anesthesia Assessment:                           - Prior to the procedure, a History and Physical                            was performed, and patient medications and                            allergies were reviewed. The patient's tolerance of                            previous anesthesia was also reviewed. The risks                            and benefits of the procedure and the sedation                            options and risks were discussed with the patient.                            All questions were answered, and informed consent                            was obtained. Anticoagulants: The patient has taken                            aspirin. It was decided not to withhold this                            medication prior to the procedure. ASA Grade                            Assessment: II - A patient with mild systemic                            disease. After reviewing the risks and benefits,                            the patient was deemed in satisfactory condition to                            undergo the procedure.  After obtaining informed consent, the colonoscope                            was passed under direct vision. Throughout the                            procedure, the patient's blood pressure, pulse, and                            oxygen saturations were monitored continuously. The                            Colonoscope was introduced through the anus and                            advanced to the the cecum, identified by                   appendiceal orifice and ileocecal valve. The                            colonoscopy was performed without difficulty. The                            patient tolerated the procedure well. The quality                            of the bowel preparation was good. The ileocecal                            valve, appendiceal orifice, and rectum were                            photographed. The quality of the bowel preparation                            was evaluated using the BBPS [facility] Bowel                            Preparation Scale) with scores of: Right Colon = 2,                            Transverse Colon = 2 and Left Colon = 2. The total                            BBPS score equals 6. The bowel preparation used was                            SUPREP. Scope In: 1:41:44 PM Scope Out: 2:03:34 PM Scope Withdrawal Time: 0 hours 17 minutes 41 seconds  Total Procedure Duration: 0 hours 21 minutes 50 seconds  Findings:                 The perianal and digital rectal examinations were  normal.                           A 6 mm polyp was found in the proximal transverse                            colon. The polyp was sessile. The polyp was removed                            with a cold snare. Resection and retrieval were                            complete.                           The exam was otherwise without abnormality on                            direct and retroflexion views. Complications:            No immediate complications. Estimated Blood Loss:     Estimated blood loss: none. Impression:               - One 6 mm polyp in the proximal transverse colon,                            removed with a cold snare. Resected and retrieved.                           - The examination was otherwise normal on direct                            and retroflexion views.                           The patient's symptoms improved after changing SSRI                             medicine. Recommendation:           - Patient has a contact number available for                            emergencies. The signs and symptoms of potential                            delayed complications were discussed with the                            patient. Return to normal activities tomorrow.                            Written discharge instructions were provided to the                            patient.                           -  Resume previous diet.                           - Continue present medications.                           - Await pathology results.                           - Repeat colonoscopy is recommended for                            surveillance. The colonoscopy date will be                            determined after pathology results from today's                            exam become available for review. Mahari Vankirk L. Loletha Carrow, MD 02/27/2017 2:06:56 PM This report has been signed electronically.

## 2017-02-27 NOTE — Progress Notes (Signed)
Called to room to assist during endoscopic procedure.  Patient ID and intended procedure confirmed with present staff. Received instructions for my participation in the procedure from the performing physician.  

## 2017-03-02 ENCOUNTER — Telehealth: Payer: Self-pay | Admitting: *Deleted

## 2017-03-02 NOTE — Telephone Encounter (Signed)
  Follow up Call-  Call back number 02/27/2017  Post procedure Call Back phone  # [phone]  Permission to leave phone message Yes  Some recent data might be hidden     Patient questions:  Do you have a fever, pain , or abdominal swelling? No. Pain Score  0 *  Have you tolerated food without any problems? Yes.    Have you been able to return to your normal activities? Yes.    Do you have any questions about your discharge instructions: Diet   No. Medications  No. Follow up visit  No.  Do you have questions or concerns about your Care? Yes.    Actions: * If pain score is 4 or above: No action needed, pain <4.

## 2017-03-04 ENCOUNTER — Encounter: Payer: Self-pay | Admitting: Gastroenterology

## 2018-12-24 ENCOUNTER — Other Ambulatory Visit: Payer: Self-pay

## 2018-12-24 ENCOUNTER — Emergency Department (HOSPITAL_COMMUNITY): Payer: 59

## 2018-12-24 ENCOUNTER — Emergency Department (HOSPITAL_COMMUNITY)
Admission: EM | Admit: 2018-12-24 | Discharge: 2018-12-24 | Disposition: A | Discharge status: Home / Self Care | Payer: 59 | Attending: Emergency Medicine | Admitting: Emergency Medicine

## 2018-12-24 ENCOUNTER — Encounter (HOSPITAL_COMMUNITY): Payer: Self-pay | Admitting: Emergency Medicine

## 2018-12-24 DIAGNOSIS — S81812A Laceration without foreign body, left lower leg, initial encounter: Secondary | ICD-10-CM | POA: Diagnosis present

## 2018-12-24 DIAGNOSIS — Y929 Unspecified place or not applicable: Secondary | ICD-10-CM | POA: Diagnosis not present

## 2018-12-24 DIAGNOSIS — Z85828 Personal history of other malignant neoplasm of skin: Secondary | ICD-10-CM | POA: Diagnosis not present

## 2018-12-24 DIAGNOSIS — Z79899 Other long term (current) drug therapy: Secondary | ICD-10-CM | POA: Insufficient documentation

## 2018-12-24 DIAGNOSIS — Y999 Unspecified external cause status: Secondary | ICD-10-CM | POA: Diagnosis not present

## 2018-12-24 DIAGNOSIS — Z23 Encounter for immunization: Secondary | ICD-10-CM | POA: Insufficient documentation

## 2018-12-24 DIAGNOSIS — W208XXA Other cause of strike by thrown, projected or falling object, initial encounter: Secondary | ICD-10-CM | POA: Diagnosis not present

## 2018-12-24 DIAGNOSIS — Y9389 Activity, other specified: Secondary | ICD-10-CM | POA: Diagnosis not present

## 2018-12-24 MED ORDER — LIDOCAINE-EPINEPHRINE (PF) 2 %-1:200000 IJ SOLN: 10.0000 mL | Freq: Once | INTRAMUSCULAR | Status: DC

## 2018-12-24 MED ORDER — LIDOCAINE-EPINEPHRINE (PF) 2 %-1:200000 IJ SOLN
INTRAMUSCULAR | Status: AC
  Filled 2018-12-24: qty 10

## 2018-12-24 MED ORDER — POVIDONE-IODINE 10 % EX SOLN
CUTANEOUS | Status: AC
  Filled 2018-12-24: qty 15

## 2018-12-24 MED ORDER — TETANUS-DIPHTH-ACELL PERTUSSIS 5-2.5-18.5 LF-MCG/0.5 IM SUSP
0.5000 mL | Freq: Once | INTRAMUSCULAR | Status: AC
Start: 2018-12-24 — End: 2018-12-24
  Administered 2018-12-24: 0.5 mL via INTRAMUSCULAR
  Filled 2018-12-24: qty 0.5

## 2018-12-24 NOTE — ED Provider Notes (Signed)
[facility] EMERGENCY DEPARTMENT Provider Note   CSN: [id number] Arrival date & time: 12/24/18  1815    History   Chief Complaint Chief Complaint  Patient presents with  . Laceration    HPI Sheila Dawson is a 63 y.o. female presenting to the emergency department with a sudden onset of laceration to the left lower leg that occurred prior to arrival.  Patient states she was moving a bookshelf using a dolly and the bookshelf slipped, falling onto her leg causing a laceration.  The laceration is to the distal anterior aspect of the left lower leg, bleeding is controlled.  She has associated pain that is worse with palpation.  No interventions tried prior to arrival.  No history of immunocompromise or diabetes.  No other injuries reported.  Tetanus vaccine was 5 years ago.     The history is provided by the patient.    Past Medical History:  Diagnosis Date  . Anxiety   . Asthmatic bronchitis   . Basal cell adenoma    forehead  . DDD (degenerative disc disease)   . Edema   . Fibromyalgia   . GERD (gastroesophageal reflux disease)   . Hyperlipidemia   . IBS (irritable bowel syndrome)   . Migraine headache   . Obesity   . Sebaceous cyst   . Venous insufficiency     Patient Active Problem List   Diagnosis Date Noted  . Palpitations 03/30/2012  . Anxiety and depression 03/13/2011  . [place], MILD 02/16/2009  . MIGRAINE HEADACHE 02/16/2009  . 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 FIBROMYALGIA 02/16/2009  . SEBACEOUS CYST 02/10/2008  . OBESITY 06/23/2007  . VENOUS INSUFFICIENCY 06/23/2007  . ASTHMATIC BRONCHITIS, ACUTE 06/23/2007  . GERD 06/23/2007  . IRRITABLE BOWEL SYNDROME 06/23/2007  . EDEMA 06/23/2007    Past Surgical History:  Procedure Laterality Date  . ABDOMINAL HYSTERECTOMY  1993  . C-5, C-6 & C-6, C-7 anterior cervical disectomy and fusion with allograft and plating  01/2005   Dr. Lorin Mercy  . CHOLECYSTECTOMY  1988  . HEMORRHOID  SURGERY  2001   Dr. Margot Chimes  . laser surgery for endometriosis  1993   Dr. Margot Chimes  . MOHS SURGERY       OB History   No obstetric history on file.      Home Medications    Prior to Admission medications   Medication Sig Start Date End Date Taking? Authorizing Provider  aspirin 81 MG tablet Take 81 mg by mouth 2 (two) times daily.    [provider]  beta carotene w/minerals (OCUVITE) tablet Take 1 tablet by mouth daily.    [provider]  buPROPion (WELLBUTRIN XL) 300 MG 24 hr tablet Take 1 tablet by mouth daily. 09/20/15   [provider]  Calcium Carbonate-Vitamin D (CALCIUM 600+D) 600-400 MG-UNIT per tablet Take 1 tablet by mouth daily.      [provider]  cetirizine (ZYRTEC) 10 MG tablet Take 10 mg by mouth daily.    [provider]  cholecalciferol (VITAMIN D) 1000 UNITS tablet Take 1,000 Units by mouth 2 (two) times daily.      [provider]  Coenzyme Q10 100 MG TABS Take 1 tablet by mouth daily.      [provider]  diphenhydramine-acetaminophen (TYLENOL PM) 25-500 MG TABS Take 1 tablet by mouth at bedtime.     [provider]  DULoxetine (CYMBALTA) 60 MG capsule Take 60 mg by mouth daily.  [provider]  guaiFENesin (MUCINEX) 600 MG 12 hr tablet Take 600 mg by mouth daily.    [provider]  ibuprofen (ADVIL,MOTRIN) 200 MG tablet Take 600 mg by mouth daily as needed for moderate pain. Per bottle as needed    [provider]  Javier Docker Oil 300 MG CAPS Take 300 mg by mouth daily.    [provider]  montelukast (SINGULAIR) 10 MG tablet Take 1 tablet by mouth daily. 12/03/16   [provider]  tiZANidine (ZANAFLEX) 4 MG tablet Take 2-6 mg by mouth 3 (three) times daily. Takes 2mg  in am, 2mg  in lunch and 6mg  in evening    [provider]  vitamin C (ASCORBIC ACID) 500 MG tablet Take 500 mg by mouth daily.      [provider]    Family History  Family History  Problem Relation Age of Onset  . Colon cancer Neg Hx     Social History Social History   Tobacco Use  . Smoking status: Never Smoker  . Smokeless tobacco: Never Used  Substance Use Topics  . Alcohol use: No    Comment: social use  . Drug use: No     Allergies   Pristiq [desvenlafaxine]; Amoxicillin-pot clavulanate; Codeine; Sulfa antibiotics; and Trazodone and nefazodone   Review of Systems Review of Systems  Musculoskeletal: Positive for myalgias.  Skin: Positive for wound.  Neurological: Negative for numbness.  All other systems reviewed and are negative.    Physical Exam Updated Vital Signs BP (!) 145/80 (BP Location: Right Arm)   Pulse 81   Temp 98.5 F (36.9 C) (Oral)   Resp 14   Ht 5\' 7"  (1.702 m)   Wt 99.8 kg   SpO2 96%   BMI 34.46 kg/m   Physical Exam Vitals signs and nursing note reviewed.  Constitutional:      General: She is not in acute distress.    Appearance: She is well-developed.  HENT:     Head: Normocephalic and atraumatic.  Eyes:     Conjunctiva/sclera: Conjunctivae normal.  Cardiovascular:     Rate and Rhythm: Normal rate.  Pulmonary:     Effort: Pulmonary effort is normal.  Abdominal:     Palpations: Abdomen is soft.  Musculoskeletal:     Comments: Left lower leg with about 5 cm curvilinear laceration to the anterior distal aspect - proximal to the ankle.  The wound is gaping about 1 cm.  The depth appears to be about 5 mm.  Base of the wound visualized, fascia appears to be intact.  No obvious tendon or muscle injury.  No obvious foreign bodies.  Patient with normal distal sensation and pulses.  Normal active dorsi flexion of the ankle against resistance.  Skin:    General: Skin is warm.  Neurological:     Mental Status: She is alert.  Psychiatric:        Mood and Affect: Mood normal.        Behavior: Behavior normal.      ED Treatments / Results  Labs (all labs ordered are listed, but only abnormal  results are displayed) Labs Reviewed - No data to display  EKG None  Radiology Dg Tibia/fibula Left  Result Date: 12/24/2018 CLINICAL DATA:  Pain status post injury. EXAM: LEFT TIBIA AND FIBULA - 2 VIEW COMPARISON:  None. FINDINGS: No acute displaced fracture or dislocation. A laceration with surrounding soft tissue swelling is noted involving the distal left lower extremity. There is no  radiopaque foreign body. There are serpiginous structures in the lateral aspect of the left lower extremity favored to represent varicose veins. There is a moderate-sized plantar calcaneal spur. IMPRESSION: 1. No acute displaced fracture or dislocation. 2. Laceration vomiting the distal left lower extremity with no evidence of a radiopaque foreign body. Electronically Signed   By: Constance Holster M.D.   On: 12/24/2018 19:11    Procedures .Marland KitchenLaceration Repair Date/Time: 12/24/2018 7:48 PM Performed by: Arhan Mcmanamon, [place] N, PA-C Authorized by: [place] Carmack, [place] N, PA-C   Consent:    Consent obtained:  Verbal   Consent given by:  Patient   Risks discussed:  Infection, pain and poor cosmetic result   Alternatives discussed:  No treatment Anesthesia (see MAR for exact dosages):    Anesthesia method:  Local infiltration   Local anesthetic:  Lidocaine 2% WITH epi Laceration details:    Location:  Leg   Leg location:  L lower leg   Length (cm):  5   Depth (mm):  5 Repair type:    Repair type:  Intermediate Pre-procedure details:    Preparation:  Patient was prepped and draped in usual sterile fashion and imaging obtained to evaluate for foreign bodies Exploration:    Hemostasis achieved with:  Direct pressure   Wound exploration: wound explored through full range of motion and entire depth of wound probed and visualized     Wound extent: no foreign bodies/material noted, no muscle damage noted, no nerve damage noted, no tendon damage noted and no vascular damage noted     Contaminated: no   Treatment:     Area cleansed with:  Saline   Amount of cleaning:  Extensive   Visualized foreign bodies/material removed: no   Subcutaneous repair:    Suture size:  5-0   Suture material:  Vicryl   Suture technique:  Simple interrupted   Number of sutures:  2 Skin repair:    Repair method:  Sutures   Suture size:  4-0   Suture material:  Nylon (ethilon)   Suture technique:  Horizontal mattress and simple interrupted   Number of sutures:  6 (3 simple, 3 horizontal mattress) Approximation:    Approximation:  Close Post-procedure details:    Dressing:  Non-adherent dressing   Patient tolerance of procedure:  Tolerated well, no immediate complications   (including critical care time)  Medications Ordered in ED Medications  lidocaine-EPINEPHrine (XYLOCAINE W/EPI) 2 %-1:200000 (PF) injection 10 mL (has no administration in time range)  Tdap (BOOSTRIX) injection 0.5 mL (has no administration in time range)  povidone-iodine (BETADINE) 10 % external solution (has no administration in time range)  lidocaine-EPINEPHrine (XYLOCAINE W/EPI) 2 %-1:200000 (PF) injection (has no administration in time range)     Initial Impression / Assessment and Plan / ED Course  I have reviewed the triage vital signs and the nursing notes.  Pertinent labs & imaging results that were available during my care of the patient were reviewed by me and considered in my medical decision making (see chart for details).        Patient with left lower leg laceration after a bookshelf fell on her leg today.  No history of immunocompromise.  Tetanus is updated.  X-ray is negative for fracture or retained foreign body.  Base of wound visualized without evidence of tendon injury.  Wound does not appear to violate the fascia.  Neurovascularly intact.  Wound closed and dressed.  Recommend symptomatic management including elevation, ice, wound care.  Outpatient follow-up in  8 to 10 days for wound recheck and suture removal.  No history of  immunocompromise, wound appears clean, patient will not be discharged with antibiotics.  Agreeable to plan and safe for discharge.  Discussed results, findings, treatment and follow up. Patient advised of return precautions. Patient verbalized understanding and agreed with plan.,  Final Clinical Impressions(s) / ED Diagnoses   Final diagnoses:  Laceration of left lower leg, initial encounter    ED Discharge Orders    None       Markeeta Scalf, [place] N, PA-C 12/24/18 Lisa Roca, MD 12/24/18 [id number]

## 2018-12-24 NOTE — Discharge Instructions (Addendum)
Please read instructions below.  Keep your wound clean and covered. In 24 hours, you can get your wound wet; gently clean it with soap and water, pat it dry, and reapply a clean bandage.  Do not soak your wound. Keep your leg elevated as much as possible.  Apply ice for 20 minutes at a time to help with swelling and pain. You can take over-the-counter medications as needed for pain Follow up with your primary care or urgent care for wound recheck and suture removal in about 8-10 days.  Return to the ER for fever, pus draining from wound, redness, or new or worsening symptoms.

## 2018-12-24 NOTE — ED Triage Notes (Signed)
Pt had a bookcase fall on her left lower leg she has a laceration to the left lower leg bleeding controled and it is dressed

## 2018-12-24 NOTE — ED Notes (Signed)
Suture tray to bedside.

## 2019-10-18 ENCOUNTER — Other Ambulatory Visit: Payer: Self-pay | Admitting: Nurse Practitioner

## 2019-10-18 DIAGNOSIS — Z1231 Encounter for screening mammogram for malignant neoplasm of breast: Secondary | ICD-10-CM

## 2019-10-31 ENCOUNTER — Other Ambulatory Visit: Payer: Self-pay

## 2019-10-31 ENCOUNTER — Ambulatory Visit
Admission: RE | Admit: 2019-10-31 | Discharge: 2019-10-31 | Disposition: A | Discharge status: Home / Self Care | Payer: 59 | Source: Ambulatory Visit | Attending: Nurse Practitioner | Admitting: Nurse Practitioner

## 2019-10-31 DIAGNOSIS — Z1231 Encounter for screening mammogram for malignant neoplasm of breast: Secondary | ICD-10-CM

## 2020-09-28 ENCOUNTER — Other Ambulatory Visit: Payer: Self-pay | Admitting: Nurse Practitioner

## 2020-09-28 DIAGNOSIS — M8580 Other specified disorders of bone density and structure, unspecified site: Secondary | ICD-10-CM

## 2020-10-30 ENCOUNTER — Other Ambulatory Visit: Payer: Self-pay | Admitting: Nurse Practitioner

## 2020-10-30 DIAGNOSIS — Z1231 Encounter for screening mammogram for malignant neoplasm of breast: Secondary | ICD-10-CM

## 2020-12-20 ENCOUNTER — Ambulatory Visit: Payer: 59

## 2021-02-13 ENCOUNTER — Other Ambulatory Visit: Payer: Self-pay

## 2021-02-13 ENCOUNTER — Ambulatory Visit
Admission: RE | Admit: 2021-02-13 | Discharge: 2021-02-13 | Disposition: A | Discharge status: Home / Self Care | Payer: Medicare Other | Source: Ambulatory Visit | Attending: Nurse Practitioner | Admitting: Nurse Practitioner

## 2021-02-13 DIAGNOSIS — Z1231 Encounter for screening mammogram for malignant neoplasm of breast: Secondary | ICD-10-CM

## 2021-04-12 ENCOUNTER — Other Ambulatory Visit: Payer: 59

## 2021-09-23 ENCOUNTER — Ambulatory Visit
Admission: RE | Admit: 2021-09-23 | Discharge: 2021-09-23 | Disposition: A | Discharge status: Home / Self Care | Payer: Medicare Other | Source: Ambulatory Visit | Attending: Nurse Practitioner | Admitting: Nurse Practitioner

## 2021-09-23 DIAGNOSIS — M8580 Other specified disorders of bone density and structure, unspecified site: Secondary | ICD-10-CM

## 2022-04-08 ENCOUNTER — Encounter: Payer: Self-pay | Admitting: Gastroenterology
